# Patient Record
Sex: Male | Born: 1955 | Race: White | Hispanic: No | Marital: Single | Smoking: Current every day smoker
Health system: Southern US, Community
[De-identification: ages and names within clinical notes are randomized; demographics above are authoritative.]

## PROBLEM LIST (undated history)

## (undated) DIAGNOSIS — E785 Hyperlipidemia, unspecified: Secondary | ICD-10-CM

## (undated) DIAGNOSIS — F17200 Nicotine dependence, unspecified, uncomplicated: Secondary | ICD-10-CM

## (undated) DIAGNOSIS — J189 Pneumonia, unspecified organism: Secondary | ICD-10-CM

## (undated) DIAGNOSIS — B20 Human immunodeficiency virus [HIV] disease: Secondary | ICD-10-CM

## (undated) DIAGNOSIS — F419 Anxiety disorder, unspecified: Secondary | ICD-10-CM

## (undated) DIAGNOSIS — Z21 Asymptomatic human immunodeficiency virus [HIV] infection status: Secondary | ICD-10-CM

## (undated) DIAGNOSIS — B351 Tinea unguium: Secondary | ICD-10-CM

## (undated) DIAGNOSIS — J42 Unspecified chronic bronchitis: Secondary | ICD-10-CM

## (undated) DIAGNOSIS — J9383 Other pneumothorax: Secondary | ICD-10-CM

## (undated) HISTORY — PX: CHEST TUBE INSERTION: SHX231

## (undated) HISTORY — DX: Other pneumothorax: J93.83

## (undated) HISTORY — DX: Pneumonia, unspecified organism: J18.9

## (undated) HISTORY — DX: Tinea unguium: B35.1

## (undated) HISTORY — DX: Anxiety disorder, unspecified: F41.9

## (undated) HISTORY — DX: Nicotine dependence, unspecified, uncomplicated: F17.200

## (undated) HISTORY — DX: Asymptomatic human immunodeficiency virus (hiv) infection status: Z21

## (undated) HISTORY — DX: Human immunodeficiency virus (HIV) disease: B20

## (undated) HISTORY — DX: Unspecified chronic bronchitis: J42

---

## 1995-12-12 ENCOUNTER — Encounter (INDEPENDENT_AMBULATORY_CARE_PROVIDER_SITE_OTHER): Payer: Self-pay | Admitting: *Deleted

## 1995-12-12 LAB — CONVERTED CEMR LAB: CD4 Count: 670 microliters

## 2000-01-04 ENCOUNTER — Ambulatory Visit (HOSPITAL_COMMUNITY): Admission: RE | Admit: 2000-01-04 | Discharge: 2000-01-04 | Payer: Self-pay | Admitting: Infectious Diseases

## 2000-01-04 ENCOUNTER — Encounter: Admission: RE | Admit: 2000-01-04 | Discharge: 2000-01-04 | Payer: Self-pay | Admitting: Infectious Diseases

## 2000-01-23 ENCOUNTER — Encounter: Admission: RE | Admit: 2000-01-23 | Discharge: 2000-01-23 | Payer: Self-pay | Admitting: Infectious Diseases

## 2000-01-27 ENCOUNTER — Ambulatory Visit (HOSPITAL_COMMUNITY): Admission: RE | Admit: 2000-01-27 | Discharge: 2000-01-27 | Payer: Self-pay | Admitting: Infectious Diseases

## 2000-02-29 ENCOUNTER — Ambulatory Visit (HOSPITAL_COMMUNITY): Admission: RE | Admit: 2000-02-29 | Discharge: 2000-02-29 | Payer: Self-pay | Admitting: Family Medicine

## 2000-02-29 ENCOUNTER — Encounter: Payer: Self-pay | Admitting: Family Medicine

## 2000-04-04 ENCOUNTER — Encounter: Admission: RE | Admit: 2000-04-04 | Discharge: 2000-04-04 | Payer: Self-pay | Admitting: Internal Medicine

## 2000-04-18 ENCOUNTER — Encounter: Admission: RE | Admit: 2000-04-18 | Discharge: 2000-04-18 | Payer: Self-pay | Admitting: Infectious Diseases

## 2000-04-18 ENCOUNTER — Ambulatory Visit (HOSPITAL_COMMUNITY): Admission: RE | Admit: 2000-04-18 | Discharge: 2000-04-18 | Payer: Self-pay | Admitting: Infectious Diseases

## 2000-05-07 ENCOUNTER — Encounter: Admission: RE | Admit: 2000-05-07 | Discharge: 2000-05-07 | Payer: Self-pay | Admitting: Infectious Diseases

## 2000-07-30 ENCOUNTER — Encounter: Admission: RE | Admit: 2000-07-30 | Discharge: 2000-07-30 | Payer: Self-pay | Admitting: Infectious Diseases

## 2000-07-30 ENCOUNTER — Ambulatory Visit (HOSPITAL_COMMUNITY): Admission: RE | Admit: 2000-07-30 | Discharge: 2000-07-30 | Payer: Self-pay | Admitting: Infectious Diseases

## 2000-08-13 ENCOUNTER — Encounter: Admission: RE | Admit: 2000-08-13 | Discharge: 2000-08-13 | Payer: Self-pay | Admitting: Infectious Diseases

## 2001-02-04 ENCOUNTER — Ambulatory Visit (HOSPITAL_COMMUNITY): Admission: RE | Admit: 2001-02-04 | Discharge: 2001-02-04 | Payer: Self-pay | Admitting: Infectious Diseases

## 2001-02-04 ENCOUNTER — Encounter: Admission: RE | Admit: 2001-02-04 | Discharge: 2001-02-04 | Payer: Self-pay | Admitting: Infectious Diseases

## 2001-02-26 ENCOUNTER — Encounter: Admission: RE | Admit: 2001-02-26 | Discharge: 2001-02-26 | Payer: Self-pay | Admitting: Infectious Diseases

## 2001-08-14 ENCOUNTER — Ambulatory Visit (HOSPITAL_COMMUNITY): Admission: RE | Admit: 2001-08-14 | Discharge: 2001-08-14 | Payer: Self-pay | Admitting: Infectious Diseases

## 2001-08-14 ENCOUNTER — Encounter: Admission: RE | Admit: 2001-08-14 | Discharge: 2001-08-14 | Payer: Self-pay | Admitting: Internal Medicine

## 2001-09-16 ENCOUNTER — Encounter: Admission: RE | Admit: 2001-09-16 | Discharge: 2001-09-16 | Payer: Self-pay | Admitting: Infectious Diseases

## 2001-10-21 ENCOUNTER — Encounter: Admission: RE | Admit: 2001-10-21 | Discharge: 2001-10-21 | Payer: Self-pay | Admitting: Infectious Diseases

## 2001-11-20 ENCOUNTER — Encounter: Admission: RE | Admit: 2001-11-20 | Discharge: 2001-11-20 | Payer: Self-pay | Admitting: Infectious Diseases

## 2001-11-20 ENCOUNTER — Ambulatory Visit (HOSPITAL_COMMUNITY): Admission: RE | Admit: 2001-11-20 | Discharge: 2001-11-20 | Payer: Self-pay | Admitting: Infectious Diseases

## 2001-12-11 ENCOUNTER — Encounter: Payer: Self-pay | Admitting: Family Medicine

## 2001-12-11 ENCOUNTER — Ambulatory Visit (HOSPITAL_COMMUNITY): Admission: RE | Admit: 2001-12-11 | Discharge: 2001-12-11 | Payer: Self-pay | Admitting: Family Medicine

## 2001-12-23 ENCOUNTER — Encounter: Admission: RE | Admit: 2001-12-23 | Discharge: 2001-12-23 | Payer: Self-pay | Admitting: Infectious Diseases

## 2001-12-30 ENCOUNTER — Encounter: Payer: Self-pay | Admitting: Infectious Diseases

## 2001-12-30 ENCOUNTER — Encounter: Admission: RE | Admit: 2001-12-30 | Discharge: 2001-12-30 | Payer: Self-pay | Admitting: Infectious Diseases

## 2001-12-30 ENCOUNTER — Ambulatory Visit (HOSPITAL_COMMUNITY): Admission: RE | Admit: 2001-12-30 | Discharge: 2001-12-30 | Payer: Self-pay | Admitting: Infectious Diseases

## 2002-01-13 ENCOUNTER — Encounter: Admission: RE | Admit: 2002-01-13 | Discharge: 2002-01-13 | Payer: Self-pay | Admitting: Infectious Diseases

## 2002-03-31 ENCOUNTER — Encounter: Admission: RE | Admit: 2002-03-31 | Discharge: 2002-03-31 | Payer: Self-pay | Admitting: Infectious Diseases

## 2002-03-31 ENCOUNTER — Ambulatory Visit (HOSPITAL_COMMUNITY): Admission: RE | Admit: 2002-03-31 | Discharge: 2002-03-31 | Payer: Self-pay | Admitting: Infectious Diseases

## 2002-04-21 ENCOUNTER — Encounter: Admission: RE | Admit: 2002-04-21 | Discharge: 2002-04-21 | Payer: Self-pay | Admitting: Infectious Diseases

## 2002-07-10 ENCOUNTER — Encounter: Admission: RE | Admit: 2002-07-10 | Discharge: 2002-07-10 | Payer: Self-pay | Admitting: Infectious Diseases

## 2002-07-23 ENCOUNTER — Encounter: Payer: Self-pay | Admitting: Emergency Medicine

## 2002-07-23 ENCOUNTER — Emergency Department (HOSPITAL_COMMUNITY): Admission: EM | Admit: 2002-07-23 | Discharge: 2002-07-23 | Payer: Self-pay | Admitting: Emergency Medicine

## 2002-07-29 ENCOUNTER — Encounter: Admission: RE | Admit: 2002-07-29 | Discharge: 2002-07-29 | Payer: Self-pay | Admitting: Infectious Diseases

## 2002-10-27 ENCOUNTER — Encounter (INDEPENDENT_AMBULATORY_CARE_PROVIDER_SITE_OTHER): Payer: Self-pay | Admitting: Infectious Diseases

## 2002-10-27 ENCOUNTER — Encounter: Admission: RE | Admit: 2002-10-27 | Discharge: 2002-10-27 | Payer: Self-pay | Admitting: Infectious Diseases

## 2002-11-03 ENCOUNTER — Encounter: Payer: Self-pay | Admitting: Family Medicine

## 2002-11-03 ENCOUNTER — Ambulatory Visit (HOSPITAL_COMMUNITY): Admission: RE | Admit: 2002-11-03 | Discharge: 2002-11-03 | Payer: Self-pay | Admitting: Family Medicine

## 2002-11-17 ENCOUNTER — Encounter: Admission: RE | Admit: 2002-11-17 | Discharge: 2002-11-17 | Payer: Self-pay | Admitting: Infectious Diseases

## 2003-02-16 ENCOUNTER — Encounter (INDEPENDENT_AMBULATORY_CARE_PROVIDER_SITE_OTHER): Payer: Self-pay | Admitting: Infectious Diseases

## 2003-02-16 ENCOUNTER — Ambulatory Visit (HOSPITAL_COMMUNITY): Admission: RE | Admit: 2003-02-16 | Discharge: 2003-02-16 | Payer: Self-pay | Admitting: Infectious Diseases

## 2003-02-16 ENCOUNTER — Encounter: Admission: RE | Admit: 2003-02-16 | Discharge: 2003-02-16 | Payer: Self-pay | Admitting: Infectious Diseases

## 2003-03-02 ENCOUNTER — Encounter: Admission: RE | Admit: 2003-03-02 | Discharge: 2003-03-02 | Payer: Self-pay | Admitting: Infectious Diseases

## 2003-05-02 ENCOUNTER — Ambulatory Visit (HOSPITAL_COMMUNITY): Admission: RE | Admit: 2003-05-02 | Discharge: 2003-05-02 | Payer: Self-pay | Admitting: Family Medicine

## 2003-06-03 ENCOUNTER — Ambulatory Visit (HOSPITAL_COMMUNITY): Admission: RE | Admit: 2003-06-03 | Discharge: 2003-06-03 | Payer: Self-pay | Admitting: Family Medicine

## 2003-06-11 ENCOUNTER — Ambulatory Visit (HOSPITAL_COMMUNITY): Admission: RE | Admit: 2003-06-11 | Discharge: 2003-06-11 | Payer: Self-pay | Admitting: Family Medicine

## 2003-06-15 ENCOUNTER — Encounter: Admission: RE | Admit: 2003-06-15 | Discharge: 2003-06-15 | Payer: Self-pay | Admitting: Infectious Diseases

## 2003-06-29 ENCOUNTER — Encounter: Admission: RE | Admit: 2003-06-29 | Discharge: 2003-06-29 | Payer: Self-pay | Admitting: Infectious Diseases

## 2003-10-27 ENCOUNTER — Encounter: Admission: RE | Admit: 2003-10-27 | Discharge: 2003-10-27 | Payer: Self-pay | Admitting: Infectious Diseases

## 2003-10-27 ENCOUNTER — Ambulatory Visit (HOSPITAL_COMMUNITY): Admission: RE | Admit: 2003-10-27 | Discharge: 2003-10-27 | Payer: Self-pay | Admitting: Infectious Diseases

## 2003-11-09 ENCOUNTER — Encounter: Admission: RE | Admit: 2003-11-09 | Discharge: 2003-11-09 | Payer: Self-pay | Admitting: Infectious Diseases

## 2004-03-15 ENCOUNTER — Ambulatory Visit: Payer: Self-pay | Admitting: Infectious Diseases

## 2004-04-07 ENCOUNTER — Ambulatory Visit: Payer: Self-pay | Admitting: Infectious Diseases

## 2004-04-07 ENCOUNTER — Ambulatory Visit (HOSPITAL_COMMUNITY): Admission: RE | Admit: 2004-04-07 | Discharge: 2004-04-07 | Payer: Self-pay | Admitting: Infectious Diseases

## 2004-04-25 ENCOUNTER — Ambulatory Visit: Payer: Self-pay | Admitting: Infectious Diseases

## 2004-09-26 ENCOUNTER — Ambulatory Visit (HOSPITAL_COMMUNITY): Admission: RE | Admit: 2004-09-26 | Discharge: 2004-09-26 | Payer: Self-pay | Admitting: Infectious Diseases

## 2004-09-26 ENCOUNTER — Ambulatory Visit: Payer: Self-pay | Admitting: Infectious Diseases

## 2004-10-10 ENCOUNTER — Ambulatory Visit: Payer: Self-pay | Admitting: Infectious Diseases

## 2005-04-03 ENCOUNTER — Ambulatory Visit: Payer: Self-pay | Admitting: Infectious Diseases

## 2005-04-03 ENCOUNTER — Ambulatory Visit (HOSPITAL_COMMUNITY): Admission: RE | Admit: 2005-04-03 | Discharge: 2005-04-03 | Payer: Self-pay | Admitting: Infectious Diseases

## 2005-04-24 ENCOUNTER — Ambulatory Visit: Payer: Self-pay | Admitting: Infectious Diseases

## 2005-08-14 ENCOUNTER — Encounter (INDEPENDENT_AMBULATORY_CARE_PROVIDER_SITE_OTHER): Payer: Self-pay | Admitting: *Deleted

## 2005-08-14 ENCOUNTER — Ambulatory Visit: Payer: Self-pay | Admitting: Infectious Diseases

## 2005-08-14 ENCOUNTER — Encounter: Admission: RE | Admit: 2005-08-14 | Discharge: 2005-08-14 | Payer: Self-pay | Admitting: Infectious Diseases

## 2005-08-28 ENCOUNTER — Ambulatory Visit: Payer: Self-pay | Admitting: Infectious Diseases

## 2005-12-25 ENCOUNTER — Encounter (INDEPENDENT_AMBULATORY_CARE_PROVIDER_SITE_OTHER): Payer: Self-pay | Admitting: *Deleted

## 2005-12-25 ENCOUNTER — Encounter: Admission: RE | Admit: 2005-12-25 | Discharge: 2005-12-25 | Payer: Self-pay | Admitting: Infectious Diseases

## 2005-12-25 ENCOUNTER — Ambulatory Visit: Payer: Self-pay | Admitting: Infectious Diseases

## 2005-12-25 LAB — CONVERTED CEMR LAB: HIV 1 RNA Quant: 1480 copies/mL

## 2006-01-08 ENCOUNTER — Ambulatory Visit: Payer: Self-pay | Admitting: Infectious Diseases

## 2006-03-30 HISTORY — PX: OTHER SURGICAL HISTORY: SHX169

## 2006-06-04 ENCOUNTER — Ambulatory Visit: Payer: Self-pay | Admitting: Infectious Diseases

## 2006-06-04 ENCOUNTER — Encounter (INDEPENDENT_AMBULATORY_CARE_PROVIDER_SITE_OTHER): Payer: Self-pay | Admitting: *Deleted

## 2006-06-04 ENCOUNTER — Encounter: Admission: RE | Admit: 2006-06-04 | Discharge: 2006-06-04 | Payer: Self-pay | Admitting: Infectious Diseases

## 2006-06-04 LAB — CONVERTED CEMR LAB
ALT: 15 units/L (ref 0–53)
AST: 17 units/L (ref 0–37)
Albumin: 4.2 g/dL (ref 3.5–5.2)
Alkaline Phosphatase: 86 units/L (ref 39–117)
BUN: 14 mg/dL (ref 6–23)
Basophils Absolute: 0 10*3/uL (ref 0.0–0.1)
CD4 Count: 690 microliters
CO2: 26 meq/L (ref 19–32)
Calcium: 9.2 mg/dL (ref 8.4–10.5)
Chloride: 106 meq/L (ref 96–112)
HCT: 48.5 % (ref 39.0–52.0)
HIV 1 RNA Quant: 2280 copies/mL — ABNORMAL HIGH (ref <50)
HIV-1 RNA Quant, Log: 3.36 — ABNORMAL HIGH (ref <1.70)
Lymphs Abs: 2.7 10*3/uL (ref 0.7–3.3)
Monocytes Absolute: 0.5 10*3/uL (ref 0.2–0.7)
Monocytes Relative: 8 % (ref 3–11)
Platelets: 162 10*3/uL (ref 150–400)
Sodium: 139 meq/L (ref 135–145)
Total Bilirubin: 0.5 mg/dL (ref 0.3–1.2)
Total Protein: 7.6 g/dL (ref 6.0–8.3)

## 2006-06-18 ENCOUNTER — Ambulatory Visit: Payer: Self-pay | Admitting: Infectious Diseases

## 2006-07-19 DIAGNOSIS — F17200 Nicotine dependence, unspecified, uncomplicated: Secondary | ICD-10-CM | POA: Insufficient documentation

## 2006-07-19 DIAGNOSIS — B20 Human immunodeficiency virus [HIV] disease: Secondary | ICD-10-CM | POA: Insufficient documentation

## 2006-07-19 DIAGNOSIS — B351 Tinea unguium: Secondary | ICD-10-CM | POA: Insufficient documentation

## 2006-07-19 DIAGNOSIS — F411 Generalized anxiety disorder: Secondary | ICD-10-CM | POA: Insufficient documentation

## 2006-07-19 DIAGNOSIS — J939 Pneumothorax, unspecified: Secondary | ICD-10-CM | POA: Insufficient documentation

## 2006-07-19 DIAGNOSIS — J930 Spontaneous tension pneumothorax: Secondary | ICD-10-CM

## 2006-07-23 ENCOUNTER — Encounter (INDEPENDENT_AMBULATORY_CARE_PROVIDER_SITE_OTHER): Payer: Self-pay | Admitting: *Deleted

## 2006-07-23 LAB — CONVERTED CEMR LAB

## 2006-07-24 ENCOUNTER — Encounter (INDEPENDENT_AMBULATORY_CARE_PROVIDER_SITE_OTHER): Payer: Self-pay | Admitting: Infectious Diseases

## 2006-08-05 ENCOUNTER — Encounter (INDEPENDENT_AMBULATORY_CARE_PROVIDER_SITE_OTHER): Payer: Self-pay | Admitting: *Deleted

## 2006-10-16 ENCOUNTER — Encounter: Admission: RE | Admit: 2006-10-16 | Discharge: 2006-10-16 | Payer: Self-pay | Admitting: *Deleted

## 2007-01-25 ENCOUNTER — Encounter: Admission: RE | Admit: 2007-01-25 | Discharge: 2007-01-25 | Payer: Self-pay | Admitting: Infectious Disease

## 2007-01-25 ENCOUNTER — Ambulatory Visit: Payer: Self-pay | Admitting: Infectious Disease

## 2007-01-25 LAB — CONVERTED CEMR LAB
AST: 16 units/L (ref 0–37)
Alkaline Phosphatase: 83 units/L (ref 39–117)
BUN: 12 mg/dL (ref 6–23)
Basophils Relative: 0 % (ref 0–1)
Calcium: 8.9 mg/dL (ref 8.4–10.5)
Creatinine, Ser: 1 mg/dL (ref 0.40–1.50)
Eosinophils Absolute: 0.3 10*3/uL (ref 0.0–0.7)
Eosinophils Relative: 4 % (ref 0–5)
Hemoglobin: 16.6 g/dL (ref 13.0–17.0)
MCHC: 34.9 g/dL (ref 30.0–36.0)
MCV: 93 fL (ref 78.0–100.0)
Monocytes Absolute: 0.4 10*3/uL (ref 0.2–0.7)
Monocytes Relative: 6 % (ref 3–11)
RBC: 5.12 M/uL (ref 4.22–5.81)
Total Bilirubin: 0.5 mg/dL (ref 0.3–1.2)

## 2007-02-11 ENCOUNTER — Ambulatory Visit: Payer: Self-pay | Admitting: Infectious Disease

## 2007-02-11 DIAGNOSIS — R1310 Dysphagia, unspecified: Secondary | ICD-10-CM | POA: Insufficient documentation

## 2007-05-08 ENCOUNTER — Telehealth: Payer: Self-pay | Admitting: Internal Medicine

## 2007-05-10 ENCOUNTER — Ambulatory Visit (HOSPITAL_COMMUNITY): Admission: RE | Admit: 2007-05-10 | Discharge: 2007-05-10 | Payer: Self-pay | Admitting: Internal Medicine

## 2007-05-10 ENCOUNTER — Ambulatory Visit: Payer: Self-pay | Admitting: Internal Medicine

## 2007-05-10 DIAGNOSIS — R05 Cough: Secondary | ICD-10-CM

## 2007-05-10 DIAGNOSIS — R059 Cough, unspecified: Secondary | ICD-10-CM | POA: Insufficient documentation

## 2007-05-15 ENCOUNTER — Ambulatory Visit: Payer: Self-pay | Admitting: Internal Medicine

## 2007-05-16 ENCOUNTER — Encounter: Payer: Self-pay | Admitting: Internal Medicine

## 2007-05-17 ENCOUNTER — Telehealth: Payer: Self-pay

## 2007-05-21 ENCOUNTER — Telehealth: Payer: Self-pay | Admitting: Internal Medicine

## 2007-05-28 ENCOUNTER — Encounter: Payer: Self-pay | Admitting: Internal Medicine

## 2007-06-10 ENCOUNTER — Ambulatory Visit: Payer: Self-pay | Admitting: Infectious Disease

## 2007-06-10 ENCOUNTER — Encounter: Admission: RE | Admit: 2007-06-10 | Discharge: 2007-06-10 | Payer: Self-pay | Admitting: Infectious Disease

## 2007-06-10 LAB — CONVERTED CEMR LAB
Albumin: 4.1 g/dL (ref 3.5–5.2)
Alkaline Phosphatase: 77 units/L (ref 39–117)
BUN: 13 mg/dL (ref 6–23)
Calcium: 9.3 mg/dL (ref 8.4–10.5)
Cholesterol: 124 mg/dL (ref 0–200)
Eosinophils Absolute: 0.3 10*3/uL (ref 0.0–0.7)
Glucose, Bld: 107 mg/dL — ABNORMAL HIGH (ref 70–99)
HCT: 48.5 % (ref 39.0–52.0)
HDL: 27 mg/dL — ABNORMAL LOW (ref >39)
LDL Cholesterol: 61 mg/dL (ref 0–99)
Lymphs Abs: 2.4 10*3/uL (ref 0.7–4.0)
MCV: 93.4 fL (ref 78.0–100.0)
Monocytes Relative: 6 % (ref 3–12)
Neutrophils Relative %: 46 % (ref 43–77)
Potassium: 4.4 meq/L (ref 3.5–5.3)
RBC: 5.19 M/uL (ref 4.22–5.81)
Triglycerides: 182 mg/dL — ABNORMAL HIGH (ref <150)
WBC: 5.6 10*3/uL (ref 4.0–10.5)

## 2007-06-28 ENCOUNTER — Ambulatory Visit: Payer: Self-pay | Admitting: Internal Medicine

## 2007-09-30 ENCOUNTER — Ambulatory Visit: Payer: Self-pay | Admitting: Internal Medicine

## 2007-09-30 ENCOUNTER — Encounter: Admission: RE | Admit: 2007-09-30 | Discharge: 2007-09-30 | Payer: Self-pay | Admitting: Internal Medicine

## 2007-09-30 LAB — CONVERTED CEMR LAB
Albumin: 3.9 g/dL (ref 3.5–5.2)
Alkaline Phosphatase: 72 units/L (ref 39–117)
BUN: 13 mg/dL (ref 6–23)
CO2: 25 meq/L (ref 19–32)
Eosinophils Absolute: 0.2 10*3/uL (ref 0.0–0.7)
Eosinophils Relative: 5 % (ref 0–5)
Glucose, Bld: 84 mg/dL (ref 70–99)
HCT: 45.6 % (ref 39.0–52.0)
HIV 1 RNA Quant: 6520 copies/mL — ABNORMAL HIGH (ref <50)
HIV-1 RNA Quant, Log: 3.81 — ABNORMAL HIGH (ref <1.70)
Hemoglobin: 15.4 g/dL (ref 13.0–17.0)
Lymphs Abs: 2.3 10*3/uL (ref 0.7–4.0)
MCV: 95 fL (ref 78.0–100.0)
Monocytes Relative: 8 % (ref 3–12)
RBC: 4.8 M/uL (ref 4.22–5.81)
Total Bilirubin: 0.5 mg/dL (ref 0.3–1.2)
WBC: 4.8 10*3/uL (ref 4.0–10.5)

## 2007-10-16 ENCOUNTER — Ambulatory Visit: Payer: Self-pay | Admitting: Internal Medicine

## 2007-11-28 ENCOUNTER — Telehealth: Payer: Self-pay | Admitting: Internal Medicine

## 2008-01-28 ENCOUNTER — Ambulatory Visit: Payer: Self-pay | Admitting: Internal Medicine

## 2008-01-28 LAB — CONVERTED CEMR LAB
ALT: 12 units/L (ref 0–53)
Albumin: 4 g/dL (ref 3.5–5.2)
CO2: 23 meq/L (ref 19–32)
Calcium: 8.9 mg/dL (ref 8.4–10.5)
Chloride: 106 meq/L (ref 96–112)
Cholesterol: 117 mg/dL (ref 0–200)
Eosinophils Relative: 7 % — ABNORMAL HIGH (ref 0–5)
Glucose, Bld: 95 mg/dL (ref 70–99)
HCT: 46.5 % (ref 39.0–52.0)
HIV 1 RNA Quant: 8820 copies/mL — ABNORMAL HIGH (ref <50)
HIV-1 RNA Quant, Log: 3.95 — ABNORMAL HIGH (ref <1.70)
Hemoglobin: 15.5 g/dL (ref 13.0–17.0)
Lymphocytes Relative: 41 % (ref 12–46)
Lymphs Abs: 2.4 10*3/uL (ref 0.7–4.0)
Neutro Abs: 2.6 10*3/uL (ref 1.7–7.7)
Platelets: 156 10*3/uL (ref 150–400)
Sodium: 141 meq/L (ref 135–145)
Total Bilirubin: 0.4 mg/dL (ref 0.3–1.2)
Total Protein: 7.5 g/dL (ref 6.0–8.3)
Triglycerides: 212 mg/dL — ABNORMAL HIGH (ref <150)
VLDL: 42 mg/dL — ABNORMAL HIGH (ref 0–40)
WBC: 5.9 10*3/uL (ref 4.0–10.5)

## 2008-02-14 ENCOUNTER — Ambulatory Visit: Payer: Self-pay | Admitting: Internal Medicine

## 2008-02-14 DIAGNOSIS — J180 Bronchopneumonia, unspecified organism: Secondary | ICD-10-CM | POA: Insufficient documentation

## 2008-02-21 ENCOUNTER — Encounter: Payer: Self-pay | Admitting: Internal Medicine

## 2008-03-06 ENCOUNTER — Ambulatory Visit: Payer: Self-pay | Admitting: Internal Medicine

## 2008-03-09 ENCOUNTER — Encounter: Payer: Self-pay | Admitting: Internal Medicine

## 2008-03-14 IMAGING — CR DG CHEST 2V
2 series · 2 of 2 positions shown · non-contrast
Comparison: 10/16/2006

CLINICAL DATA: Short of breath.  Cough for three weeks.   Smoking history. 
 CHEST ? 2 VIEW:

[view not recorded (1 of 2)]
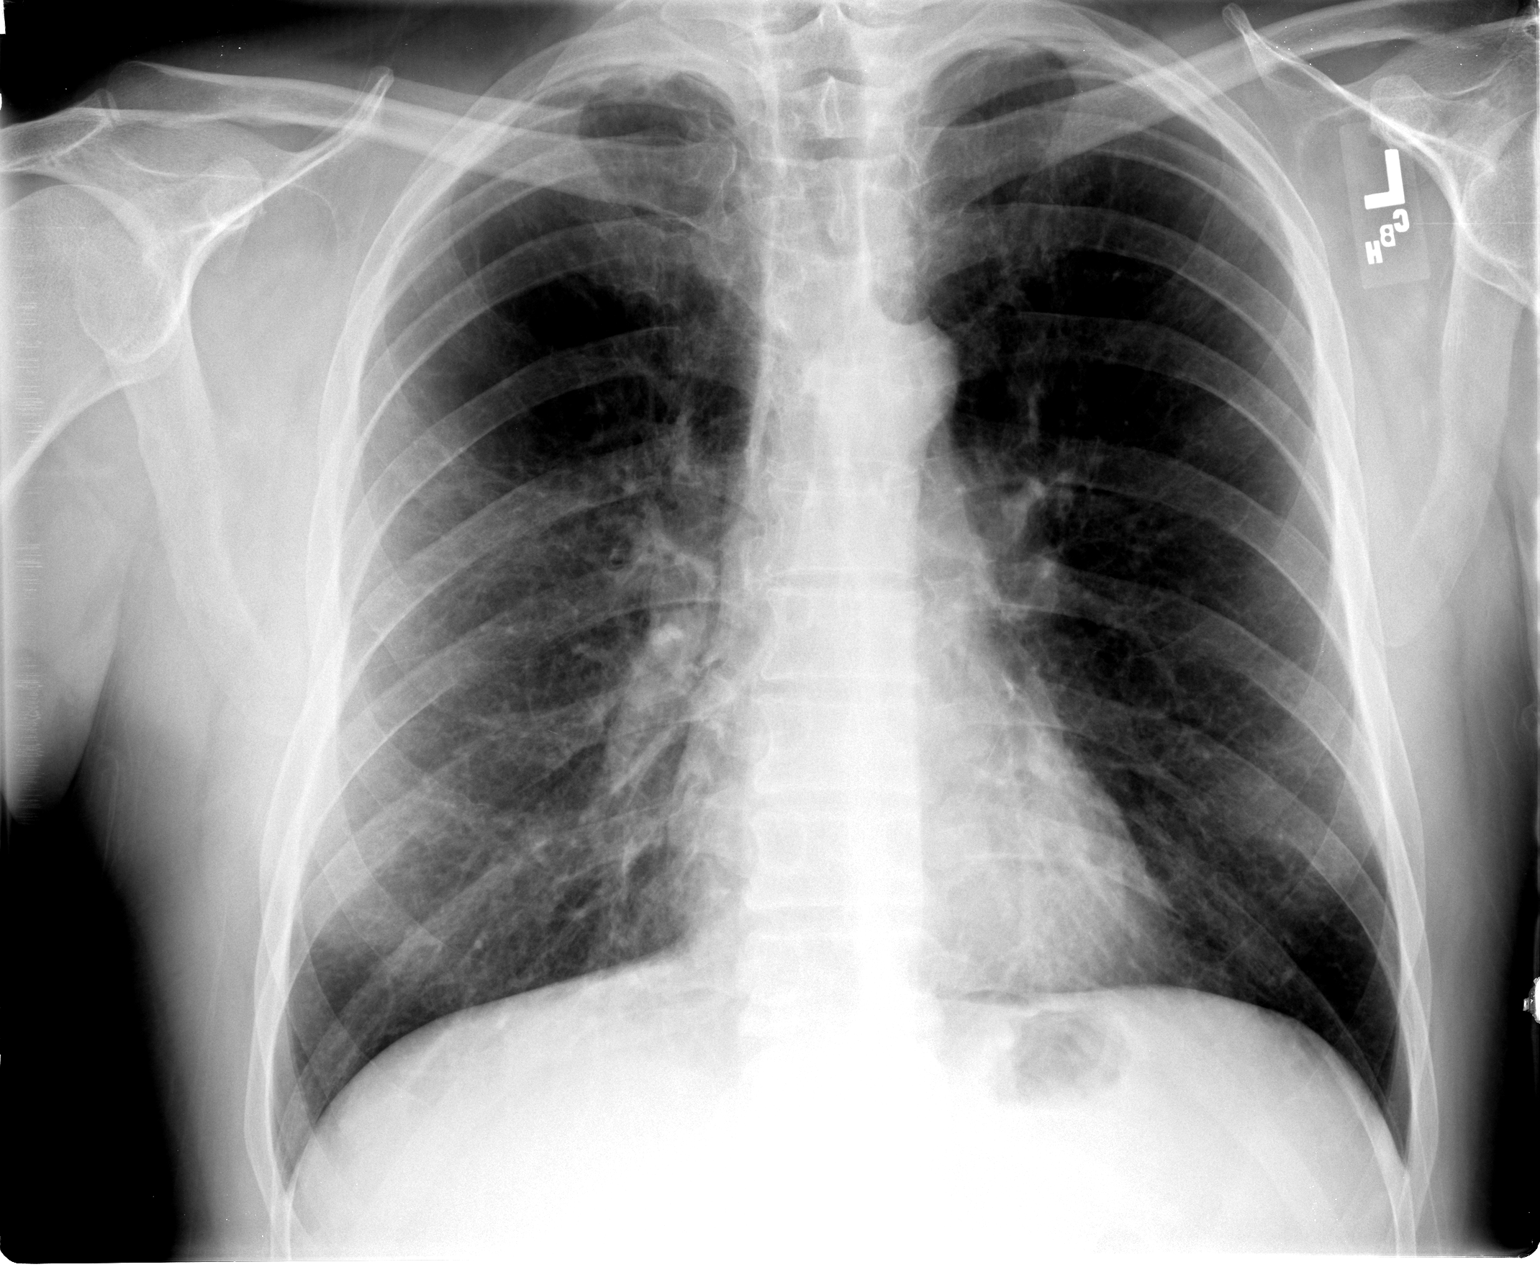

[view not recorded (2 of 2)]
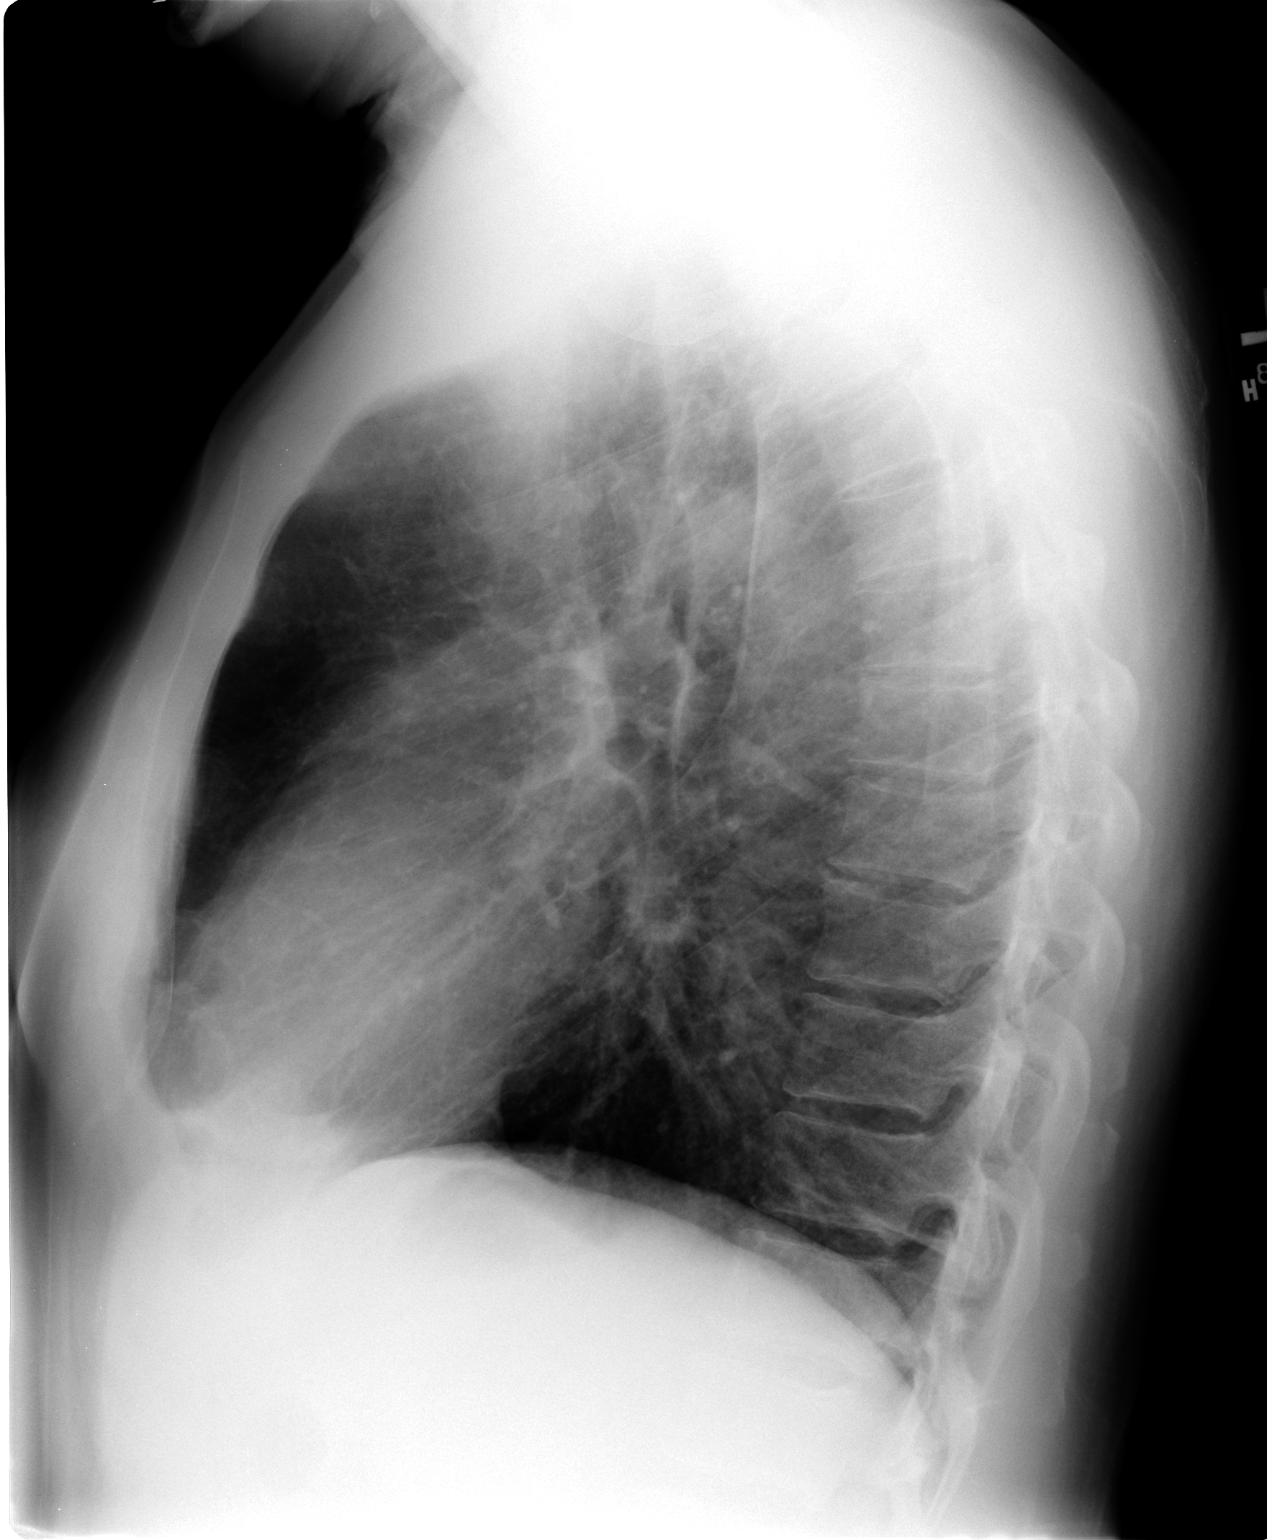

[2 of 2 positions shown; findings below may reference images not displayed]

FINDINGS: The heart and mediastinum are unremarkable.  There is bronchial thickening with no infiltrate, collapse or effusion.  There is chronic pleural and parenchymal scarring at the apices.
IMPRESSION: Bronchitis without consolidation or collapse.

## 2008-06-22 ENCOUNTER — Telehealth: Payer: Self-pay | Admitting: Internal Medicine

## 2008-06-23 ENCOUNTER — Ambulatory Visit: Payer: Self-pay | Admitting: Internal Medicine

## 2008-06-29 ENCOUNTER — Telehealth: Payer: Self-pay | Admitting: Internal Medicine

## 2008-06-30 ENCOUNTER — Encounter: Payer: Self-pay | Admitting: Internal Medicine

## 2008-07-01 ENCOUNTER — Telehealth (INDEPENDENT_AMBULATORY_CARE_PROVIDER_SITE_OTHER): Payer: Self-pay | Admitting: Licensed Clinical Social Worker

## 2008-07-17 ENCOUNTER — Ambulatory Visit: Payer: Self-pay | Admitting: Internal Medicine

## 2008-07-17 DIAGNOSIS — K047 Periapical abscess without sinus: Secondary | ICD-10-CM | POA: Insufficient documentation

## 2008-07-17 DIAGNOSIS — J42 Unspecified chronic bronchitis: Secondary | ICD-10-CM

## 2008-07-17 DIAGNOSIS — R141 Gas pain: Secondary | ICD-10-CM | POA: Insufficient documentation

## 2008-07-17 DIAGNOSIS — R142 Eructation: Secondary | ICD-10-CM

## 2008-07-17 DIAGNOSIS — R143 Flatulence: Secondary | ICD-10-CM

## 2008-07-17 DIAGNOSIS — N529 Male erectile dysfunction, unspecified: Secondary | ICD-10-CM

## 2008-08-14 LAB — CONVERTED CEMR LAB: Testosterone: 313.99 ng/dL — ABNORMAL LOW (ref 350.00–890)

## 2008-09-10 ENCOUNTER — Ambulatory Visit: Payer: Self-pay | Admitting: Internal Medicine

## 2008-09-10 LAB — CONVERTED CEMR LAB
ALT: 14 units/L (ref 0–53)
AST: 18 units/L (ref 0–37)
Albumin: 3.9 g/dL (ref 3.5–5.2)
Alkaline Phosphatase: 79 units/L (ref 39–117)
Basophils Relative: 0 % (ref 0–1)
Calcium: 9.4 mg/dL (ref 8.4–10.5)
Chloride: 106 meq/L (ref 96–112)
Eosinophils Absolute: 0.2 10*3/uL (ref 0.0–0.7)
Eosinophils Relative: 3 % (ref 0–5)
HCT: 46.1 % (ref 39.0–52.0)
HDL: 25 mg/dL — ABNORMAL LOW (ref >39)
LDL Cholesterol: 42 mg/dL (ref 0–99)
Lymphs Abs: 2.5 10*3/uL (ref 0.7–4.0)
MCHC: 34.1 g/dL (ref 30.0–36.0)
MCV: 91.8 fL (ref 78.0–100.0)
Monocytes Relative: 9 % (ref 3–12)
Neutrophils Relative %: 48 % (ref 43–77)
Platelets: 149 10*3/uL — ABNORMAL LOW (ref 150–400)
Potassium: 4.1 meq/L (ref 3.5–5.3)
Sodium: 142 meq/L (ref 135–145)
Testosterone: 340.57 ng/dL — ABNORMAL LOW (ref 350–890)
Total CHOL/HDL Ratio: 5.2
Total Protein: 7.7 g/dL (ref 6.0–8.3)
WBC: 6.3 10*3/uL (ref 4.0–10.5)

## 2008-09-22 ENCOUNTER — Ambulatory Visit: Payer: Self-pay | Admitting: Internal Medicine

## 2008-09-23 ENCOUNTER — Telehealth (INDEPENDENT_AMBULATORY_CARE_PROVIDER_SITE_OTHER): Payer: Self-pay | Admitting: *Deleted

## 2008-09-23 ENCOUNTER — Encounter: Payer: Self-pay | Admitting: Internal Medicine

## 2008-10-02 ENCOUNTER — Encounter: Admission: RE | Admit: 2008-10-02 | Discharge: 2008-10-02 | Payer: Self-pay | Admitting: Internal Medicine

## 2008-10-02 ENCOUNTER — Encounter: Payer: Self-pay | Admitting: Internal Medicine

## 2008-10-05 ENCOUNTER — Ambulatory Visit: Payer: Self-pay | Admitting: Internal Medicine

## 2008-10-05 LAB — CONVERTED CEMR LAB
ALT: 18 units/L (ref 0–53)
AST: 21 units/L (ref 0–37)
Albumin: 3.5 g/dL (ref 3.5–5.2)
Alkaline Phosphatase: 72 units/L (ref 39–117)
Basophils Absolute: 0 10*3/uL (ref 0.0–0.1)
Basophils Relative: 0.3 % (ref 0.0–3.0)
Calcium: 8.9 mg/dL (ref 8.4–10.5)
Eosinophils Relative: 3.4 % (ref 0.0–5.0)
GFR calc non Af Amer: 93.99 mL/min (ref >60)
HDL: 17.5 mg/dL — ABNORMAL LOW (ref >39.00)
Hemoglobin: 15.6 g/dL (ref 13.0–17.0)
LDL Cholesterol: 69 mg/dL (ref 0–99)
Lymphocytes Relative: 43.9 % (ref 12.0–46.0)
Monocytes Relative: 7.4 % (ref 3.0–12.0)
Neutro Abs: 2.3 10*3/uL (ref 1.4–7.7)
Nitrite: NEGATIVE
Potassium: 4.2 meq/L (ref 3.5–5.1)
RBC: 4.87 M/uL (ref 4.22–5.81)
RDW: 13.7 % (ref 11.5–14.6)
Sodium: 145 meq/L (ref 135–145)
Total CHOL/HDL Ratio: 7
VLDL: 31 mg/dL (ref 0.0–40.0)
WBC Urine, dipstick: NEGATIVE
WBC: 5.2 10*3/uL (ref 4.5–10.5)

## 2008-10-19 ENCOUNTER — Ambulatory Visit: Payer: Self-pay | Admitting: Internal Medicine

## 2008-10-19 DIAGNOSIS — E291 Testicular hypofunction: Secondary | ICD-10-CM

## 2008-10-23 ENCOUNTER — Ambulatory Visit: Payer: Self-pay | Admitting: Internal Medicine

## 2008-11-06 ENCOUNTER — Encounter: Payer: Self-pay | Admitting: Internal Medicine

## 2008-11-09 ENCOUNTER — Encounter: Payer: Self-pay | Admitting: Internal Medicine

## 2008-11-20 ENCOUNTER — Ambulatory Visit: Payer: Self-pay | Admitting: Internal Medicine

## 2008-12-22 ENCOUNTER — Ambulatory Visit: Payer: Self-pay | Admitting: Internal Medicine

## 2008-12-24 ENCOUNTER — Ambulatory Visit: Payer: Self-pay | Admitting: Internal Medicine

## 2008-12-24 LAB — CONVERTED CEMR LAB
Albumin: 4 g/dL (ref 3.5–5.2)
Alkaline Phosphatase: 70 units/L (ref 39–117)
BUN: 11 mg/dL (ref 6–23)
Calcium: 9.2 mg/dL (ref 8.4–10.5)
Glucose, Bld: 78 mg/dL (ref 70–99)
HIV 1 RNA Quant: 19500 copies/mL — ABNORMAL HIGH (ref <48)
HIV-1 RNA Quant, Log: 4.29 — ABNORMAL HIGH (ref <1.68)
Hemoglobin: 15.3 g/dL (ref 13.0–17.0)
Lymphocytes Relative: 58 % — ABNORMAL HIGH (ref 12–46)
Monocytes Absolute: 0.5 10*3/uL (ref 0.1–1.0)
Monocytes Relative: 9 % (ref 3–12)
Neutro Abs: 1.7 10*3/uL (ref 1.7–7.7)
Potassium: 4 meq/L (ref 3.5–5.3)
RBC: 4.95 M/uL (ref 4.22–5.81)

## 2009-01-05 ENCOUNTER — Ambulatory Visit: Payer: Self-pay | Admitting: Internal Medicine

## 2009-01-05 DIAGNOSIS — L989 Disorder of the skin and subcutaneous tissue, unspecified: Secondary | ICD-10-CM | POA: Insufficient documentation

## 2009-01-11 ENCOUNTER — Ambulatory Visit: Payer: Self-pay | Admitting: Internal Medicine

## 2009-01-11 ENCOUNTER — Telehealth: Payer: Self-pay | Admitting: Internal Medicine

## 2009-01-11 DIAGNOSIS — C44309 Unspecified malignant neoplasm of skin of other parts of face: Secondary | ICD-10-CM | POA: Insufficient documentation

## 2009-01-11 DIAGNOSIS — J0100 Acute maxillary sinusitis, unspecified: Secondary | ICD-10-CM

## 2009-01-11 DIAGNOSIS — C44300 Unspecified malignant neoplasm of skin of unspecified part of face: Secondary | ICD-10-CM

## 2009-01-19 ENCOUNTER — Ambulatory Visit: Payer: Self-pay | Admitting: Internal Medicine

## 2009-01-19 LAB — CONVERTED CEMR LAB: Testosterone: 281.34 ng/dL — ABNORMAL LOW (ref 350.00–890.00)

## 2009-01-27 ENCOUNTER — Encounter: Payer: Self-pay | Admitting: Internal Medicine

## 2009-01-28 ENCOUNTER — Telehealth: Payer: Self-pay | Admitting: Internal Medicine

## 2009-03-22 ENCOUNTER — Ambulatory Visit: Payer: Self-pay | Admitting: Internal Medicine

## 2009-03-22 DIAGNOSIS — F410 Panic disorder [episodic paroxysmal anxiety] without agoraphobia: Secondary | ICD-10-CM

## 2009-03-31 ENCOUNTER — Ambulatory Visit: Payer: Self-pay | Admitting: Psychology

## 2009-04-01 ENCOUNTER — Ambulatory Visit: Payer: Self-pay | Admitting: Psychology

## 2009-04-07 ENCOUNTER — Ambulatory Visit: Payer: Self-pay | Admitting: Psychology

## 2009-04-09 ENCOUNTER — Ambulatory Visit: Payer: Self-pay | Admitting: Psychology

## 2009-04-16 ENCOUNTER — Ambulatory Visit: Payer: Self-pay | Admitting: Psychology

## 2009-04-21 ENCOUNTER — Ambulatory Visit: Payer: Self-pay | Admitting: Psychology

## 2009-04-26 ENCOUNTER — Ambulatory Visit: Payer: Self-pay | Admitting: Internal Medicine

## 2009-04-26 LAB — CONVERTED CEMR LAB
ALT: 14 units/L (ref 0–53)
Basophils Relative: 1 % (ref 0–1)
CO2: 23 meq/L (ref 19–32)
Chloride: 107 meq/L (ref 96–112)
HIV 1 RNA Quant: 31400 copies/mL — ABNORMAL HIGH (ref <48)
Lymphs Abs: 3.3 10*3/uL (ref 0.7–4.0)
Monocytes Relative: 8 % (ref 3–12)
Neutro Abs: 2.5 10*3/uL (ref 1.7–7.7)
Neutrophils Relative %: 38 % — ABNORMAL LOW (ref 43–77)
RBC: 4.98 M/uL (ref 4.22–5.81)
Sodium: 140 meq/L (ref 135–145)
Total Bilirubin: 0.6 mg/dL (ref 0.3–1.2)
Total Protein: 7.2 g/dL (ref 6.0–8.3)
WBC: 6.5 10*3/uL (ref 4.0–10.5)

## 2009-04-30 ENCOUNTER — Ambulatory Visit: Payer: Self-pay | Admitting: Psychology

## 2009-05-06 ENCOUNTER — Ambulatory Visit: Payer: Self-pay | Admitting: Psychology

## 2009-05-07 ENCOUNTER — Ambulatory Visit: Payer: Self-pay | Admitting: Internal Medicine

## 2009-05-11 ENCOUNTER — Ambulatory Visit: Payer: Self-pay | Admitting: Psychology

## 2009-05-18 ENCOUNTER — Ambulatory Visit: Payer: Self-pay | Admitting: Psychology

## 2009-05-26 ENCOUNTER — Ambulatory Visit: Payer: Self-pay | Admitting: Psychology

## 2009-06-14 ENCOUNTER — Ambulatory Visit: Payer: Self-pay | Admitting: Psychology

## 2009-06-17 ENCOUNTER — Telehealth: Payer: Self-pay | Admitting: Internal Medicine

## 2009-06-18 ENCOUNTER — Ambulatory Visit: Payer: Self-pay | Admitting: Internal Medicine

## 2009-06-29 ENCOUNTER — Ambulatory Visit: Payer: Self-pay | Admitting: Psychology

## 2009-07-06 ENCOUNTER — Ambulatory Visit: Payer: Self-pay | Admitting: Family Medicine

## 2009-07-06 DIAGNOSIS — L500 Allergic urticaria: Secondary | ICD-10-CM

## 2009-07-30 ENCOUNTER — Ambulatory Visit: Payer: Self-pay | Admitting: Internal Medicine

## 2009-08-05 ENCOUNTER — Encounter: Payer: Self-pay | Admitting: Internal Medicine

## 2009-08-31 ENCOUNTER — Ambulatory Visit: Payer: Self-pay | Admitting: Internal Medicine

## 2009-08-31 ENCOUNTER — Encounter: Payer: Self-pay | Admitting: Internal Medicine

## 2009-08-31 LAB — CONVERTED CEMR LAB
AST: 17 units/L (ref 0–37)
BUN: 12 mg/dL (ref 6–23)
Calcium: 9 mg/dL (ref 8.4–10.5)
Chloride: 104 meq/L (ref 96–112)
Creatinine, Ser: 1.03 mg/dL (ref 0.40–1.50)
Eosinophils Relative: 4 % (ref 0–5)
Glucose, Bld: 80 mg/dL (ref 70–99)
HCT: 44.3 % (ref 39.0–52.0)
HIV 1 RNA Quant: 35600 copies/mL — ABNORMAL HIGH (ref <48)
HIV-1 RNA Quant, Log: 4.55 — ABNORMAL HIGH (ref <1.68)
Hemoglobin: 15.1 g/dL (ref 13.0–17.0)
Lymphocytes Relative: 49 % — ABNORMAL HIGH (ref 12–46)
Lymphs Abs: 2.6 10*3/uL (ref 0.7–4.0)
Monocytes Absolute: 0.4 10*3/uL (ref 0.1–1.0)
Monocytes Relative: 7 % (ref 3–12)
RBC: 4.82 M/uL (ref 4.22–5.81)
WBC: 5.4 10*3/uL (ref 4.0–10.5)

## 2009-09-10 ENCOUNTER — Ambulatory Visit: Payer: Self-pay | Admitting: Internal Medicine

## 2009-12-01 ENCOUNTER — Ambulatory Visit: Payer: Self-pay | Admitting: Internal Medicine

## 2009-12-01 LAB — CONVERTED CEMR LAB: HIV-1 RNA Quant, Log: 4.57 — ABNORMAL HIGH (ref <1.68)

## 2009-12-02 ENCOUNTER — Encounter: Payer: Self-pay | Admitting: Internal Medicine

## 2009-12-02 LAB — CONVERTED CEMR LAB
AST: 15 units/L (ref 0–37)
Albumin: 3.9 g/dL (ref 3.5–5.2)
Alkaline Phosphatase: 78 units/L (ref 39–117)
BUN: 12 mg/dL (ref 6–23)
Calcium: 8.8 mg/dL (ref 8.4–10.5)
Chloride: 106 meq/L (ref 96–112)
Eosinophils Relative: 6 % — ABNORMAL HIGH (ref 0–5)
Glucose, Bld: 91 mg/dL (ref 70–99)
HCT: 46.8 % (ref 39.0–52.0)
Hemoglobin: 15.4 g/dL (ref 13.0–17.0)
Lymphocytes Relative: 46 % (ref 12–46)
Lymphs Abs: 2.6 10*3/uL (ref 0.7–4.0)
Monocytes Absolute: 0.4 10*3/uL (ref 0.1–1.0)
Neutro Abs: 2.2 10*3/uL (ref 1.7–7.7)
Potassium: 4.3 meq/L (ref 3.5–5.3)
Sodium: 140 meq/L (ref 135–145)
Total Protein: 7.3 g/dL (ref 6.0–8.3)
WBC: 5.6 10*3/uL (ref 4.0–10.5)

## 2009-12-16 ENCOUNTER — Ambulatory Visit: Payer: Self-pay | Admitting: Internal Medicine

## 2010-01-28 ENCOUNTER — Ambulatory Visit: Payer: Self-pay | Admitting: Family Medicine

## 2010-02-11 ENCOUNTER — Ambulatory Visit: Payer: Self-pay | Admitting: Internal Medicine

## 2010-02-11 ENCOUNTER — Telehealth (INDEPENDENT_AMBULATORY_CARE_PROVIDER_SITE_OTHER): Payer: Self-pay | Admitting: *Deleted

## 2010-02-11 DIAGNOSIS — B370 Candidal stomatitis: Secondary | ICD-10-CM

## 2010-02-11 DIAGNOSIS — R61 Generalized hyperhidrosis: Secondary | ICD-10-CM

## 2010-02-11 DIAGNOSIS — J209 Acute bronchitis, unspecified: Secondary | ICD-10-CM | POA: Insufficient documentation

## 2010-02-21 ENCOUNTER — Telehealth: Payer: Self-pay | Admitting: Internal Medicine

## 2010-03-07 ENCOUNTER — Ambulatory Visit: Payer: Self-pay | Admitting: Internal Medicine

## 2010-03-07 LAB — CONVERTED CEMR LAB
ALT: 13 units/L (ref 0–53)
AST: 17 units/L (ref 0–37)
Alkaline Phosphatase: 81 units/L (ref 39–117)
Basophils Absolute: 0 10*3/uL (ref 0.0–0.1)
Basophils Relative: 0 % (ref 0–1)
Cholesterol: 126 mg/dL (ref 0–200)
Creatinine, Ser: 0.8 mg/dL (ref 0.40–1.50)
Eosinophils Absolute: 0.3 10*3/uL (ref 0.0–0.7)
Eosinophils Relative: 5 % (ref 0–5)
HCT: 49.9 % (ref 39.0–52.0)
HIV 1 RNA Quant: 104000 copies/mL — ABNORMAL HIGH (ref <20)
HIV-1 RNA Quant, Log: 5.02 — ABNORMAL HIGH (ref <1.30)
Hemoglobin: 16.7 g/dL (ref 13.0–17.0)
LDL Cholesterol: 49 mg/dL (ref 0–99)
Lymphocytes Relative: 45 % (ref 12–46)
MCHC: 33.5 g/dL (ref 30.0–36.0)
Monocytes Absolute: 0.3 10*3/uL (ref 0.1–1.0)
Platelets: 124 10*3/uL — ABNORMAL LOW (ref 150–400)
RDW: 14.8 % (ref 11.5–15.5)
Sodium: 141 meq/L (ref 135–145)
Total Bilirubin: 0.4 mg/dL (ref 0.3–1.2)
Total CHOL/HDL Ratio: 5.5
Total Protein: 7.2 g/dL (ref 6.0–8.3)
VLDL: 54 mg/dL — ABNORMAL HIGH (ref 0–40)

## 2010-03-08 ENCOUNTER — Encounter: Payer: Self-pay | Admitting: Internal Medicine

## 2010-03-08 LAB — CONVERTED CEMR LAB
ALT: 12 units/L (ref 0–53)
AST: 17 units/L (ref 0–37)
Albumin: 4 g/dL (ref 3.5–5.2)
BUN: 11 mg/dL (ref 6–23)
Basophils Absolute: 0 10*3/uL (ref 0.0–0.1)
Basophils Relative: 0 % (ref 0–1)
CO2: 26 meq/L (ref 19–32)
Calcium: 8.9 mg/dL (ref 8.4–10.5)
Chloride: 106 meq/L (ref 96–112)
Cholesterol: 126 mg/dL (ref 0–200)
Hemoglobin: 15.6 g/dL (ref 13.0–17.0)
Lymphocytes Relative: 43 % (ref 12–46)
MCHC: 33.9 g/dL (ref 30.0–36.0)
Monocytes Relative: 7 % (ref 3–12)
Neutro Abs: 2.8 10*3/uL (ref 1.7–7.7)
Neutrophils Relative %: 44 % (ref 43–77)
Potassium: 4.1 meq/L (ref 3.5–5.3)
RBC: 4.93 M/uL (ref 4.22–5.81)

## 2010-03-15 ENCOUNTER — Ambulatory Visit: Payer: Self-pay | Admitting: Internal Medicine

## 2010-05-03 ENCOUNTER — Ambulatory Visit: Payer: Self-pay | Admitting: Internal Medicine

## 2010-06-01 ENCOUNTER — Encounter (INDEPENDENT_AMBULATORY_CARE_PROVIDER_SITE_OTHER): Payer: Self-pay | Admitting: *Deleted

## 2010-06-01 ENCOUNTER — Encounter: Payer: Self-pay | Admitting: Internal Medicine

## 2010-06-01 ENCOUNTER — Ambulatory Visit
Admission: RE | Admit: 2010-06-01 | Discharge: 2010-06-01 | Payer: Self-pay | Source: Home / Self Care | Attending: Internal Medicine | Admitting: Internal Medicine

## 2010-06-01 LAB — CONVERTED CEMR LAB
ALT: 17 units/L (ref 0–53)
AST: 18 units/L (ref 0–37)
Albumin: 4.1 g/dL (ref 3.5–5.2)
Basophils Absolute: 0 10*3/uL (ref 0.0–0.1)
CO2: 29 meq/L (ref 19–32)
Calcium: 9.1 mg/dL (ref 8.4–10.5)
Chloride: 104 meq/L (ref 96–112)
Eosinophils Relative: 5 % (ref 0–5)
Lymphocytes Relative: 41 % (ref 12–46)
Neutro Abs: 2.9 10*3/uL (ref 1.7–7.7)
Neutrophils Relative %: 48 % (ref 43–77)
Platelets: 126 10*3/uL — ABNORMAL LOW (ref 150–400)
Potassium: 4.2 meq/L (ref 3.5–5.3)
RDW: 13.9 % (ref 11.5–15.5)
Total Protein: 7.4 g/dL (ref 6.0–8.3)

## 2010-06-02 LAB — T-HELPER CELL (CD4) - (RCID CLINIC ONLY)
CD4 % Helper T Cell: 17 % — ABNORMAL LOW (ref 33–55)
CD4 T Cell Abs: 430 uL (ref 400–2700)

## 2010-06-14 ENCOUNTER — Ambulatory Visit
Admission: RE | Admit: 2010-06-14 | Discharge: 2010-06-14 | Payer: Self-pay | Source: Home / Self Care | Attending: Internal Medicine | Admitting: Internal Medicine

## 2010-06-19 ENCOUNTER — Encounter: Payer: Self-pay | Admitting: Infectious Diseases

## 2010-06-30 NOTE — Assessment & Plan Note (Signed)
Summary: ?change of med/sorethroat/check skin cancer/cjr   Vital Signs:  Patient profile:   55 year old male Weight:      219 pounds BMI:     27.47 Temp:     98.2 degrees F oral BP sitting:   126 / 82  (left arm) Cuff size:   regular  Vitals Entered By: Raechel Ache, RN (July 30, 2009 10:18 AM) CC: Talk about meds, check spot R forehead, ?lump L neck and check toenails.   CC:  Talk about meds, check spot R forehead, and ?lump L neck and check toenails..  History of Present Illness: DICUSSION OF INCREASED AXIETHY AND THE EFFECT OF DEPRESSON IN THE INCREASE OF NEED FOR LORAZEPAM KNOT UNDER SKIN IN RIGHT CHIN AREA AK ON LEFT CJECK AT SITE OF PRIOR QUESTIONALBLE  bcca  FUNGAL NAIL WANT TO KNOW IF HE CAN TAKE ANTIFUNGALS FOR NAIL  Problems Prior to Update: 1)  Allergic Urticaria  (ICD-708.0) 2)  Panic Disorder  (ICD-300.01) 3)  Acute Maxillary Sinusitis  (ICD-461.0) 4)  Basal Cell Carcinoma, Nose  (ICD-173.3) 5)  Skin Lesion  (ICD-709.9) 6)  Testosterone Deficiency  (ICD-257.2) 7)  Physical Examination  (ICD-V70.0) 8)  Erectile Dysfunction, Organic  (ICD-607.84) 9)  Excessive Belching  (ICD-787.3) 10)  Abscess, Tooth  (ICD-522.5) 11)  Bronchitis, Chronic  (ICD-491.9) 12)  Bronchial Pneumonia  (ICD-485) 13)  Cough  (ICD-786.2) 14)  Dysphagia, Unspecified  (ICD-787.20) 15)  Smoker  (ICD-305.1) 16)  Onychomycosis, Toenails  (ICD-110.1) 17)  Spontaneous Pneumothorax  (ICD-512.8) 18)  Anxiety  (ICD-300.00) 19)  HIV Disease  (ICD-042)  Medications Prior to Update: 1)  Mirtazapine 15 Mg Tabs (Mirtazapine) .... One By Mouth  Q Hs 2)  Lorazepam 0.5 Mg Tabs (Lorazepam) .Marland Kitchen.. 1 -2 Daily As Needed  Current Medications (verified): 1)  Alprazolam 0.5 Mg Tabs (Alprazolam) .... One By Mouth Tid 2)  Lexapro 10 Mg Tabs (Escitalopram Oxalate) .... 1/2 By Mouth Daily in The Am  Allergies (verified): No Known Drug Allergies  Past History:  Family History: Last updated:  07/17/2008 No early CAD Anurysm hx in father's side father with ephasema  Social History: Last updated: 07/17/2008 Domestic Partner Current Smoker-1 ppd Alcohol use-yes Occupation:[employer]  Risk Factors: Alcohol Use: 0 (06/18/2009) Caffeine Use: 0 (05/07/2009) Exercise: yes (05/07/2009)  Risk Factors: Smoking Status: current (06/18/2009) Packs/Day: 1.0 (06/18/2009) Passive Smoke Exposure: yes (06/18/2009)  Past medical, surgical, family and social histories (including risk factors) reviewed, and no changes noted (except as noted below).  Past Medical History: Reviewed history from 07/17/2008 and no changes required. HIV disease, diagnosed in1997   ( primary did diagnosis, 1st care in  [facility]  with AZT then dual therapy with retrovir and another  stopped in 2001 and has had stable levels Anxiety Spontaneous pneumothorax Onychomycosis, tonails Smoker chronic  bronchitis possible ephasematous liung changes  Past Surgical History: Reviewed history from 07/17/2008 and no changes required. chest tube for colasped 1992  Family History: Reviewed history from 07/17/2008 and no changes required. No early CAD Anurysm hx in father's side father with ephasema  Social History: Reviewed history from 07/17/2008 and no changes required. Domestic Partner Current Smoker-1 ppd Alcohol use-yes Occupation:[employer]  Review of Systems  The patient denies anorexia, fever, weight loss, weight gain, vision loss, decreased hearing, hoarseness, chest pain, syncope, dyspnea on exertion, peripheral edema, prolonged cough, headaches, hemoptysis, abdominal pain, melena, hematochezia, severe indigestion/heartburn, hematuria, incontinence, genital sores, muscle weakness, suspicious skin lesions, transient blindness, difficulty walking,  depression, unusual weight change, abnormal bleeding, enlarged lymph nodes, angioedema, and breast masses.    Physical Exam  General:   Well-developed,well-nourished,in no acute distress; alert,appropriate and cooperative throughout examination Head:  normocephalic and atraumatic.   Eyes:  pupils equal and pupils round.   Ears:  R ear normal and L ear normal.   Nose:  External nasal examination shows no deformity or inflammation. Nasal mucosa are pink and moist without lesions or exudates. Mouth:  Oral mucosa and oropharynx without lesions or exudates.  Teeth in good repair. Neck:  No deformities,  1 CM CYST UNDER ANGLE OF RIGHT JAY  Lungs:  Normal respiratory effort, chest expands symmetrically. Lungs are clear to auscultation, no crackles or wheezes. Heart:  Normal rate and regular rhythm. S1 and S2 normal without gallop, murmur, click, rub or other extra sounds. Abdomen:  soft and no distention.   Prostate:  no gland enlargement and no nodules.   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 One by mouth  q hs    Lorazepam 0.5 Mg Tabs (Lorazepam) .Marland Kitchen... 1 -2 daily as needed His updated medication list for this problem includes:    Alprazolam 0.5 Mg Tabs (Alprazolam) ..... One by mouth tid    Lexapro 10 Mg Tabs (Escitalopram oxalate) .Marland Kitchen... 1/2 by mouth daily in the am  Discussed medication use and relaxation techniques.   Problem # 2:  ERECTILE DYSFUNCTION, ORGANIC (ICD-607.84)  Discussed proper use of medications, as well as side effects.   Problem # 3:  DERMATOPHYTOSIS OF NAIL (ICD-110.1)  Discussed nail care and medication treatment options.   Problem # 4:  SKIN LESION (ICD-709.9)  al OF LEFT TEMPLE WTH HX OF AK AND BCCA OF TEMPLE AT DERMATOLOGIST the lesion was identifies as a   ak or early bcca       and 40 seconds of cryotherapy with the liguid nitrogen  gun was apllied to the site. The pt tolerated the procedure and post procedure care was discussed  Orders: Cryotherapy/Destruction benign or premalignant lesion (1st lesion)  (17000)  Complete Medication List: 1)  Alprazolam 0.5 Mg Tabs (Alprazolam) .... One by mouth tid 2)  Lexapro 10 Mg Tabs (Escitalopram oxalate) .... 1/2 by mouth daily in the am  Patient Instructions: 1)  Please schedule a follow-up appointment in 6 weeks. Prescriptions: ALPRAZOLAM 0.5 MG TABS (ALPRAZOLAM) one by mouth TID  #90 x 3   Entered and Authorized by:   [name] MD   Signed by:   [name] MD on 07/30/2009   Method used:   Print then Give to Patient   RxID:   [id number]

## 2010-06-30 NOTE — Assessment & Plan Note (Signed)
Summary: 6WK F/U [MKJ]   Primary Provider:  [name] MD  CC:  follow-up visit and lab results.  History of Present Illness: Pt doing well except for recurent thrush,  Depression History:      The patient denies a depressed mood most of the day and a diminished interest in his usual daily activities.        The patient denies that he feels like life is not worth living, denies that he wishes that he were dead, and denies that he has thought about ending his life.        Preventive Screening-Counseling & Management  Alcohol-Tobacco     Alcohol drinks/day: 0     Alcohol type: AT TIMES -WINE     Smoking Status: current     Smoking Cessation Counseling: yes     Packs/Day: 1.0     Year Started: 1973     Passive Smoke Exposure: yes  Caffeine-Diet-Exercise     Caffeine use/day: 0     Does Patient Exercise: yes     Type of exercise: walking     Exercise (avg: min/session): 30-60     Times/week: 5  Hep-HIV-STD-Contraception     HIV Risk Counseling: 10/10/2004  Safety-Violence-Falls     Seat Belt Use: yes      Sexual History:  none.        Drug Use:  never.        Blood Transfusions:  no.        Travel History:  no.     Updated Prior Medication List: ALPRAZOLAM 0.5 MG TABS (ALPRAZOLAM) one by mouth TID LEXAPRO 10 MG TABS (ESCITALOPRAM OXALATE) one tablet one time a day FLUCONAZOLE 100 MG TABS (FLUCONAZOLE) one by mouth daily fo 14  Current Allergies (reviewed today): No known allergies  Past History:  Past Medical History: Last updated: 07/17/2008 HIV disease, diagnosed in1997   ( primary did diagnosis, 1st care in  [facility] hospital  with AZT then dual therapy with retrovir and another  stopped in 2001 and has had stable levels Anxiety Spontaneous pneumothorax Onychomycosis, tonails Smoker chronic  bronchitis possible ephasematous liung changes  Review of Systems  The patient denies anorexia, fever, and weight loss.    Vital Signs:  Patient profile:    55 year old male Height:      75 inches (190.50 cm) Weight:      219.8 pounds (99.91 kg) BMI:     27.57 Temp:     97.7 degrees F (36.50 degrees C) oral Pulse rate:   97 / minute BP sitting:   144 / 80  (left arm)  Vitals Entered By: Wendall Mola CMA Duncan Dull) (June 14, 2010 2:34 PM) CC: follow-up visit, lab results Is Patient Diabetic? No Pain Assessment Patient in pain? no      Nutritional Status BMI of 25 - 29 = overweight Nutritional Status Detail appetite "too good"  Have you ever been in a relationship where you felt threatened, hurt or afraid?No   Does patient need assistance? Functional Status Self care Ambulation Normal   Physical Exam  General:  alert, well-developed, well-nourished, and well-hydrated.   Head:  normocephalic and atraumatic.   Mouth:  pharynx pink and moist.  thrush present Lungs:  normal breath sounds.      Impression & Recommendations:  Problem # 1:  HIV DISEASE (ICD-042) Pt currently does not wish to be on HIV meds Diagnostics Reviewed:  HIV: HIV positive - not AIDS (05/07/2009)  CD4: 430 (06/02/2010)   WBC: 6.0 (06/01/2010)   Hgb: 15.8 (06/01/2010)   HCT: 46.5 (06/01/2010)   Platelets: 126 (06/01/2010) HIV genotype: See Comment (06/01/2010)   HIV-1 RNA: 83900 (06/01/2010)   HBSAg: NO (07/23/2006)  Problem # 2:  CANDIDIASIS, ORAL (ICD-112.0) fluconazole  Medications Added to Medication List This Visit: 1)  Fluconazole 100 Mg Tabs (Fluconazole) .... One by mouth daily fo 14  Other Orders: Est. Patient Level III (16109) Future Orders: T-CD4SP ([facility]) (CD4SP) ... 09/12/2010 T-HIV Viral Load [id number]) ... 09/12/2010 T-Comprehensive Metabolic Panel [id number]) ... 09/12/2010 T-CBC w/Diff ([id number]) ... 09/12/2010  Patient Instructions: 1)  Please schedule a follow-up appointment in 3 months, 2 weeks after labs.  Prescriptions: FLUCONAZOLE 100 MG TABS (FLUCONAZOLE) one by mouth daily fo 14  #14 x 0   Entered and  Authorized by:   Yisroel Ramming MD   Signed by:   Yisroel Ramming MD on 06/14/2010   Method used:   Print then Give to Patient   RxID:   [id number]

## 2010-06-30 NOTE — Assessment & Plan Note (Signed)
Summary: sick[mkj]   Primary Provider:  [name] MD  CC:  pt. c/o thrush and sorethroat and left ear pain.  History of Present Illness: Pt has been treated 2 times for thrush and now he thinks it is back again. He c/o rawness of the inside of his mouth and sore tongue. He was not on natibiotics prior to or during his thrush outbreaks. He was treated with clotrimazole tabs the first time and fluconazole the second time.  Depression History:      The patient denies a depressed mood most of the day and a diminished interest in his usual daily activities.        The patient denies that he feels like life is not worth living, denies that he wishes that he were dead, and denies that he has thought about ending his life.        Preventive Screening-Counseling & Management  Alcohol-Tobacco     Alcohol drinks/day: 0     Alcohol type: AT TIMES -WINE     Smoking Status: current     Smoking Cessation Counseling: yes     Packs/Day: 1.0     Year Started: 1973     Passive Smoke Exposure: yes  Caffeine-Diet-Exercise     Caffeine use/day: 0     Does Patient Exercise: yes     Type of exercise: walking     Exercise (avg: min/session): 30-60     Times/week: 5  Hep-HIV-STD-Contraception     HIV Risk Counseling: 10/10/2004  Safety-Violence-Falls     Seat Belt Use: yes      Sexual History:  none.        Drug Use:  never.        Blood Transfusions:  no.        Travel History:  no.     Updated Prior Medication List: ALPRAZOLAM 0.5 MG TABS (ALPRAZOLAM) one by mouth TID LEXAPRO 10 MG TABS (ESCITALOPRAM OXALATE) one tablet one time a day BROMFED DM 30-2-10 MG/5ML SYRP (PSEUDOEPH-BROMPHEN-DM) two tsp by mouth three times a day as needed FLUCONAZOLE 100 MG TABS (FLUCONAZOLE) one by mouth daily for 14 day  Current Allergies (reviewed today): No known allergies  Past History:  Past Medical History: Last updated: 07/17/2008 HIV disease, diagnosed in1997   ( primary did diagnosis, 1st  care in  [facility]  with AZT then dual therapy with retrovir and another  stopped in 2001 and has had stable levels Anxiety Spontaneous pneumothorax Onychomycosis, tonails Smoker chronic  bronchitis possible ephasematous liung changes  Review of Systems  The patient denies fever, weight loss, and dyspnea on exertion.    Vital Signs:  Patient profile:   55 year old male Height:      75 inches (190.50 cm) Weight:      214.12 pounds (97.33 kg) BMI:     26.86 Temp:     98.0 degrees F (36.67 degrees C) oral Pulse rate:   75 / minute BP sitting:   119 / 73  (right arm)  Vitals Entered By: Wendall Mola CMA Duncan Dull) (March 15, 2010 3:01 PM) CC: pt. c/o thrush, sorethroat and left ear pain Is Patient Diabetic? No Pain Assessment Patient in pain? yes     Location: throat Intensity: 3 Type: sore Onset of pain  Intermittent Nutritional Status BMI of 25 - 29 = overweight Nutritional Status Detail appetite "fine"  Does patient need assistance? Functional Status Self care Ambulation Normal   Physical Exam  General:  alert, well-developed, well-nourished, and well-hydrated.   Head:  normocephalic and atraumatic.   Ears:  R ear normal and L ear normal.   Mouth:  poor dentition.  thrush present   Impression & Recommendations:  Problem # 1:  CANDIDIASIS, ORAL (ICD-112.0) recurrent will treat with fluconazole discussed start HIV medications since this represents a weakened immune system - pt will think about it  Problem # 2:  HIV DISEASE (ICD-042) Pt will think about starting antiretroviral therapy. Diagnostics Reviewed:  HIV: HIV positive - not AIDS (05/07/2009)   CD4: 490 (03/08/2010)   WBC: 6.2 (03/08/2010)   Hgb: 15.6 (03/08/2010)   HCT: 46.0 (03/08/2010)   Platelets: 130 (03/08/2010) HIV-1 RNA: 104000 (03/07/2010)   HBSAg: NO (07/23/2006)  Other Orders: Influenza Vaccine NON MCR (96295) Prescriptions: FLUCONAZOLE 100 MG TABS (FLUCONAZOLE) one by  mouth daily for 14 day  #14 x 0   Entered and Authorized by:   Yisroel Ramming MD   Signed by:   Yisroel Ramming MD on 03/15/2010   Method used:   Print then Give to Patient   RxID:   [id number]      Immunizations Administered:  Influenza Vaccine # 1:    Vaccine Type: Fluvax Non-MCR    Site: right deltoid    Mfr: Novartis    Dose: 0.5 ml    Route: IM    Given by: Wendall Mola CMA ( AAMA)    Exp. Date: 08/28/2010    Lot #: [id number]    VIS given: 12/21/09 version given March 15, 2010.  Flu Vaccine Consent Questions:    Do you have a history of severe allergic reactions to this vaccine? no    Any prior history of allergic reactions to egg and/or gelatin? no    Do you have a sensitivity to the preservative Thimersol? no    Do you have a past history of Guillan-Barre Syndrome? no    Do you currently have an acute febrile illness? no    Have you ever had a severe reaction to latex? no    Vaccine information given and explained to patient? yes

## 2010-06-30 NOTE — Assessment & Plan Note (Signed)
Summary: COUGH,CONGESTION,FEVER,ST // RS   Vital Signs:  Patient profile:   55 year old male Height:      75 inches Weight:      216 pounds BMI:     27.10 Temp:     98.6 degrees F rectal Pulse rate:   76 / minute Resp:     14 per minute BP sitting:   120 / 78  (left arm)  Vitals Entered By: Willy Eddy, LPN (June 18, 2009 11:21 AM) CC: sorethroat on and off for about a month-cant completely clear up-- to dentist yesterday and they told him he had blisters in his mouth   CC:  sorethroat on and off for about a month-cant completely clear up-- to dentist yesterday and they told him he had blisters in his mouth.  History of Present Illness: Took z cam abput one month ago and was able to prevent a cold, but has recurrent sore throat and hoaseness Dental assistant noted sores iin mouthThe pt has not had any fevers or chills and has not develped a productive couhg he is Hiv pos cd4 was 400 at last check  Preventive Screening-Counseling & Management  Alcohol-Tobacco     Alcohol drinks/day: 0     Alcohol type: AT TIMES -WINE     Smoking Status: current     Smoking Cessation Counseling: yes     Packs/Day: 1.0     Year Started: 1973     Passive Smoke Exposure: yes  Problems Prior to Update: 1)  Panic Disorder  (ICD-300.01) 2)  Acute Maxillary Sinusitis  (ICD-461.0) 3)  Basal Cell Carcinoma, Nose  (ICD-173.3) 4)  Skin Lesion  (ICD-709.9) 5)  Testosterone Deficiency  (ICD-257.2) 6)  Physical Examination  (ICD-V70.0) 7)  Erectile Dysfunction, Organic  (ICD-607.84) 8)  Excessive Belching  (ICD-787.3) 9)  Abscess, Tooth  (ICD-522.5) 10)  Bronchitis, Chronic  (ICD-491.9) 11)  Bronchial Pneumonia  (ICD-485) 12)  Cough  (ICD-786.2) 13)  Dysphagia, Unspecified  (ICD-787.20) 14)  Smoker  (ICD-305.1) 15)  Onychomycosis, Toenails  (ICD-110.1) 16)  Spontaneous Pneumothorax  (ICD-512.8) 17)  Anxiety  (ICD-300.00) 18)  HIV Disease  (ICD-042)  Medications Prior to Update: 1)   Depo-Testosterone 200 Mg/ml Oil (Testosterone Cypionate) .... 1/2cc Every Two Weeks 2)  Mirtazapine 15 Mg Tabs (Mirtazapine) .... One By Mouth  Q Hs 3)  Lorazepam 0.5 Mg Tabs (Lorazepam) .Marland Kitchen.. 1 -2 Daily As Needed  Current Medications (verified): 1)  Mirtazapine 15 Mg Tabs (Mirtazapine) .... One By Mouth  Q Hs 2)  Lorazepam 0.5 Mg Tabs (Lorazepam) .Marland Kitchen.. 1 -2 Daily As Needed 3)  Sulfamethoxazole-Tmp Ds 800-160 Mg Tabs (Sulfamethoxazole-Trimethoprim) .... One By Mouth Two Times A Day 4)  Bromfed Dm 30-2-10 Mg/47ml Syrp (Pseudoeph-Bromphen-Dm) .... Two Tsp By Mouth Q 4 Hours  Allergies (verified): No Known Drug Allergies  Past History:  Family History: Last updated: 07/17/2008 No early CAD Anurysm hx in father's side father with ephasema  Social History: Last updated: 07/17/2008 Domestic Partner Current Smoker-1 ppd Alcohol use-yes Occupation:[employer]  Risk Factors: Alcohol Use: 0 (06/18/2009) Caffeine Use: 0 (05/07/2009) Exercise: yes (05/07/2009)  Risk Factors: Smoking Status: current (06/18/2009) Packs/Day: 1.0 (06/18/2009) Passive Smoke Exposure: yes (06/18/2009)  Past medical, surgical, family and social histories (including risk factors) reviewed, and no changes noted (except as noted below).  Past Medical History: Reviewed history from 07/17/2008 and no changes required. HIV disease, diagnosed in1997   ( primary did diagnosis, 1st care in  [facility] hospital  with AZT  then dual therapy with retrovir and another  stopped in 2001 and has had stable levels Anxiety Spontaneous pneumothorax Onychomycosis, tonails Smoker chronic  bronchitis possible ephasematous liung changes  Past Surgical History: Reviewed history from 07/17/2008 and no changes required. chest tube for colasped 1992  Family History: Reviewed history from 07/17/2008 and no changes required. No early CAD Anurysm hx in father's side father with ephasema  Social History: Reviewed history from  07/17/2008 and no changes required. Domestic Partner Current Smoker-1 ppd Alcohol use-yes Occupation:[employer]  Review of Systems       The patient complains of hoarseness and prolonged cough.  The patient denies anorexia, fever, weight loss, weight gain, vision loss, decreased hearing, chest pain, syncope, dyspnea on exertion, peripheral edema, headaches, hemoptysis, abdominal pain, melena, hematochezia, severe indigestion/heartburn, hematuria, incontinence, genital sores, muscle weakness, suspicious skin lesions, transient blindness, difficulty walking, depression, unusual weight change, abnormal bleeding, enlarged lymph nodes, angioedema, breast masses, and testicular masses.    Physical Exam  General:  alert, well-developed, well-nourished, and well-hydrated.   Head:  normocephalic and atraumatic.   Eyes:  pupils equal and pupils round.   Nose:  no external deformity and no nasal discharge.   Mouth:  pharynx pink and moist.   Neck:  No deformities, masses, or tenderness noted. Lungs:  normal respiratory effort and no wheezes.   Heart:  normal rate, regular rhythm, and bradycardia.   Abdomen:  Bowel sounds positive,abdomen soft and non-tender without masses, organomegaly or hernias noted. Msk:  No deformity or scoliosis noted of thoracic or lumbar spine.   Pulses:  R and L carotid,radial,femoral,dorsalis pedis and posterior tibial pulses are full and equal bilaterally Extremities:  No clubbing, cyanosis, edema, or deformity noted with normal full range of motion of all joints.   Neurologic:  No cranial nerve deficits noted. Station and gait are normal. Plantar reflexes are down-going bilaterally. DTRs are symmetrical throughout. Sensory, motor and coordinative functions appear intact. Skin:  basal cell ca on left nare   Impression & Recommendations:  Problem # 1:  ACUTE MAXILLARY SINUSITIS (ICD-461.0)  Instructed on treatment. Call if symptoms persist or worsen.   His updated  medication list for this problem includes:    Sulfamethoxazole-tmp Ds 800-160 Mg Tabs (Sulfamethoxazole-trimethoprim) ..... One by mouth two times a day    Bromfed Dm 30-2-10 Mg/58ml Syrp (Pseudoeph-bromphen-dm) .Marland Kitchen..Marland Kitchen Two tsp by mouth q 4 hours  Complete Medication List: 1)  Mirtazapine 15 Mg Tabs (Mirtazapine) .... One by mouth  q hs 2)  Lorazepam 0.5 Mg Tabs (Lorazepam) .Marland Kitchen.. 1 -2 daily as needed 3)  Sulfamethoxazole-tmp Ds 800-160 Mg Tabs (Sulfamethoxazole-trimethoprim) .... One by mouth two times a day 4)  Bromfed Dm 30-2-10 Mg/49ml Syrp (Pseudoeph-bromphen-dm) .... Two tsp by mouth q 4 hours  Patient Instructions: 1)  Take your antibiotic as prescribed until ALL of it is gone, but stop if you develop a rash or swelling and contact our office as soon as possible. Prescriptions: BROMFED DM 30-2-10 MG/5ML SYRP (PSEUDOEPH-BROMPHEN-DM) two tsp by mouth q 4 hours  #6 oz x 1   Entered and Authorized by:   [name] MD   Signed by:   [name] MD on 06/18/2009   Method used:   Electronically to        [facility] [phone]* (retail)       [address] [place], [place]  [address]  Ph: [phone] or [phone]       Fax: [phone]   RxID:   [id number] SULFAMETHOXAZOLE-TMP DS 800-160 MG TABS (SULFAMETHOXAZOLE-TRIMETHOPRIM) one by mouth two times a day  #20 x 0   Entered and Authorized by:   [name] MD   Signed by:   [name] MD on 06/18/2009   Method used:   Electronically to        [facility] [phone]* (retail)       [address] [place], [place]  [address]       Ph: [phone] or [phone]       Fax: [phone]   RxID:   [id number]   Appended Document: COUGH,CONGESTION,FEVER,ST // RS     Allergies: No Known Drug Allergies  Physical Exam  Nose:  nasal dischargemucosal pallor, mucosal edema, and airflow obstruction.   Mouth:  posterior lymphoid hypertrophy and postnasal drip.     Complete Medication List: 1)   Mirtazapine 15 Mg Tabs (Mirtazapine) .... One by mouth  q hs 2)  Lorazepam 0.5 Mg Tabs (Lorazepam) .Marland Kitchen.. 1 -2 daily as needed 3)  Sulfamethoxazole-tmp Ds 800-160 Mg Tabs (Sulfamethoxazole-trimethoprim) .... One by mouth two times a day 4)  Bromfed Dm 30-2-10 Mg/92ml Syrp (Pseudoeph-bromphen-dm) .... Two tsp by mouth q 4 hours

## 2010-06-30 NOTE — Progress Notes (Signed)
Summary: congestion  Phone Note Call from Patient   Caller: Patient Call For: [name] MD Summary of Call: Pt feels like he has fever, chills and cough x 2 weeks.  Productive green cough.  Non URI symptoms. [facility] [phone] Initial call taken by: Lynann Beaver CMA,  February 11, 2010 8:43 AM  Follow-up for Phone Call        may see dr Lovell Sheehan at 11:30 Follow-up by: Willy Eddy, LPN,  February 11, 2010 9:40 AM  Additional Follow-up for Phone Call Additional follow up Details #1::        notified pt. Additional Follow-up by: Lynann Beaver CMA,  February 11, 2010 9:42 AM

## 2010-06-30 NOTE — Progress Notes (Signed)
Summary: REQ FOR APPT / MEDS  Phone Note Call from Patient   Caller: Patient [phone] Reason for Call: Talk to Nurse, Talk to Doctor Summary of Call: Pt called in to schedule appt with Dr Lovell Sheehan for today / tomorrow ref to c/o congestion, cough, fever, sore throat..... Pt adv that he has tried OTC meds w/ little relief.... Pt was adv that he could come in for appt w/ another physician but pt req to see Dr Lovell Sheehan... Pt req that if he can't be seen today / tomorrow, Is there anyway that med could be called in to [facility] on [address].... Can you advise?     Pt can be reached at  [phone] with any questions or concerns.  Initial call taken by: Debbra Riding,  June 17, 2009 11:13 AM  Follow-up for Phone Call        may come tomorrow -friday- at 11 am Follow-up by: Willy Eddy, LPN,  June 17, 2009 11:18 AM  Additional Follow-up for Phone Call Additional follow up Details #1::        Phone Call Completed------Pt contacted and appt scheduled for tomorrow, Friday - 06/18/2009 @ 11am - per Rushie Goltz, LPN.

## 2010-06-30 NOTE — Assessment & Plan Note (Signed)
Summary: thrush?/dm   Vital Signs:  Patient profile:   55 year old male Temp:     98.0 degrees F oral BP sitting:   118 / 78  (left arm) Cuff size:   regular  Vitals Entered By: Sid Falcon LPN (January 28, 2010 11:06 AM)  History of Present Illness: Patient seen with intraoral rash.  Patient is HIV positive. His counts have been stable.  Saw dentist Tuesday and was diagnosed with thrush. Started on Mycelex troche which she is using 5 times daily. Here today with mild sore throat but no odynophagia. No fever or chills. No recent antibiotics. No recent prednisone use. No history of diabetes or any symptoms of hyperglycemia.  he does still smoke.  Allergies (verified): No Known Drug Allergies  Past History:  Past Medical History: Last updated: 07/17/2008 HIV disease, diagnosed in1997   ( primary did diagnosis, 1st [facility] in  [facility]  with AZT then dual therapy with retrovir and another  stopped in 2001 and has had stable levels Anxiety Spontaneous pneumothorax Onychomycosis, tonails Smoker chronic  bronchitis possible ephasematous liung changes  Social History: Last updated: 07/17/2008 Domestic Partner Current Smoker-1 ppd Alcohol use-yes Occupation:[employer] PMH reviewed for relevance, SH/Risk Factors reviewed for relevance  Physical Exam  General:  Well-developed,well-nourished,in no acute distress; alert,appropriate and cooperative throughout examination Mouth:  patient has a few very small nonspecific erythematous patches right buccal mucosa and posterior pharynx. No ulcerations. No exudate. No whitish discoloration at this time Neck:  No deformities, masses, or tenderness noted. Lungs:  Normal respiratory effort, chest expands symmetrically. Lungs are clear to auscultation, no crackles or wheezes. Heart:  normal rate and regular rhythm.     Impression & Recommendations:  Problem # 1:  THRUSH (ICD-112.0) possible recent thrush. This appears to be  resolving. Continue Mycelex throat for a full 7-10 days.  Complete Medication List: 1)  Alprazolam 0.5 Mg Tabs (Alprazolam) .... One by mouth tid 2)  Lexapro 10 Mg Tabs (Escitalopram oxalate) .... One tablet one time a day

## 2010-06-30 NOTE — Miscellaneous (Signed)
Summary: RW Financial Update   Clinical Lists Changes  Observations: Added new observation of YEARLYEXPEN: 2100  (06/01/2010 11:35) Added new observation of FINASSESSDT: 06/01/2010  (06/01/2010 11:35) Added new observation of PCTFPL: 302.15  (06/01/2010 11:35) Added new observation of HOUSEINCOME: 51761  (06/01/2010 11:35)

## 2010-06-30 NOTE — Progress Notes (Signed)
Summary: FYI  Phone Note Call from Patient Call back at [facility] Phone [phone] Call back at [phone]   Caller: Patient Call For: [name] MD Reason for Call: Acute Illness Summary of Call: FYI PT NO LONGER HAVING NIGHT SWEATS Initial call taken by: Heron Sabins,  February 21, 2010 11:31 AM  Follow-up for Phone Call        dr Lovell Sheehan informed Follow-up by: Willy Eddy, LPN,  February 21, 2010 11:42 AM

## 2010-06-30 NOTE — Assessment & Plan Note (Signed)
Summary: f/u ov/tkk   CC:  follow-up visit and lab results.  History of Present Illness: Pt here for f/u.  He has been suffering from panic attacks due to stress at work.  He has a small lump on left side of neck that he would like to have looked at.  Depression History:      The patient denies a depressed mood most of the day and a diminished interest in his usual daily activities.        The patient denies that he feels like life is not worth living, denies that he wishes that he were dead, and denies that he has thought about ending his life.        Preventive Screening-Counseling & Management  Alcohol-Tobacco     Alcohol drinks/day: 0     Alcohol type: AT TIMES -WINE     Smoking Status: current     Smoking Cessation Counseling: yes     Packs/Day: 1.0     Year Started: 1973     Passive Smoke Exposure: yes  Caffeine-Diet-Exercise     Caffeine use/day: 0     Does Patient Exercise: yes     Type of exercise: walking     Exercise (avg: min/session): 30-60     Times/week: 5  Hep-HIV-STD-Contraception     HIV Risk Counseling: 10/10/2004  Safety-Violence-Falls     Seat Belt Use: yes      Sexual History:  none.        Drug Use:  never.        Blood Transfusions:  no.        Travel History:  no.     Updated Prior Medication List: ALPRAZOLAM 0.5 MG TABS (ALPRAZOLAM) one by mouth TID LEXAPRO 10 MG TABS (ESCITALOPRAM OXALATE) 1/2 by mouth daily in the am  Current Allergies (reviewed today): No known allergies  Past History:  Past Medical History: Last updated: 07/17/2008 HIV disease, diagnosed in1997   ( primary did diagnosis, 1st care in  [facility] hospital  with AZT then dual therapy with retrovir and another  stopped in 2001 and has had stable levels Anxiety Spontaneous pneumothorax Onychomycosis, tonails Smoker chronic  bronchitis possible ephasematous liung changes  Review of Systems  The patient denies anorexia, fever, and weight loss.    Vital  Signs:  Patient profile:   55 year old male Height:      75 inches (190.50 cm) Weight:      216.8 pounds (98.55 kg) BMI:     27.20 Temp:     98.2 degrees F (36.78 degrees C) oral Pulse rate:   73 / minute BP sitting:   120 / 73  (right arm)  Vitals Entered By: Wendall Mola CMA Duncan Dull) (September 10, 2009 2:10 PM) CC: follow-up visit, lab results Is Patient Diabetic? No Pain Assessment Patient in pain? no      Nutritional Status BMI of 25 - 29 = overweight Nutritional Status Detail appetite "good"  Does patient need assistance? Functional Status Self care Ambulation Normal Comments no missed doses of meds per patient   Physical Exam  General:  alert, well-developed, well-nourished, and well-hydrated.   Head:  normocephalic and atraumatic.   Mouth:  pharynx pink and moist.   Neck:  small dime sized node or cyst one left side of neck Lungs:  normal breath sounds.      Impression & Recommendations:  Problem # 1:  HIV DISEASE (ICD-042) Pt currently not on therapy per his choice.  CD4ct stable. He will return in 3 months for labs. Diagnostics Reviewed:  HIV: HIV positive - not AIDS (05/07/2009)   CD4: 470 (09/01/2009)   WBC: 5.4 (08/31/2009)   Hgb: 15.1 (08/31/2009)   HCT: 44.3 (08/31/2009)   Platelets: 131 (08/31/2009) HIV-1 RNA: 35600 (08/31/2009)   HBSAg: NO (07/23/2006)  Other Orders: Est. Patient Level III (04540) Future Orders: T-CD4SP ([facility]) (CD4SP) ... 12/09/2009 T-HIV Viral Load [phone]) ... 12/09/2009 T-Comprehensive Metabolic Panel [phone]) ... 12/09/2009 T-CBC w/Diff ([id number]) ... 12/09/2009 T-RPR (Syphilis) [phone]) ... 12/09/2009  Patient Instructions: 1)  Please schedule a follow-up appointment in 3 months, 2 weeks after labs.

## 2010-06-30 NOTE — Miscellaneous (Signed)
Summary: Appointment Canceled  Appointment status changed to canceled by [employer] on 08/31/2009 2:37 PM.  Cancellation Comments --------------------- 42month f/u/vs  Appointment Information ----------------------- Appt Type:  ID OFFICE VISIT      Date:  Friday, September 03, 2009      Time:  2:30 PM for 15 min   Urgency:  Routine   Made By:  Hoy Finlay Scheduler  To Visit:  James Terrell    Reason:  42month f/u/vs  Appt Comments ------------- -- 08/31/09 14:37: ([facility]) CANCELED -- 42month f/u/vs -- 08/20/09 12:05: ([facility]) BOOKED -- Routine ID OFFICE VISIT at 09/03/2009 2:30 PM for 15 min 42month f/u/vs

## 2010-06-30 NOTE — Assessment & Plan Note (Signed)
Summary: TALK ABOUT MEDS/VS   Primary James Terrell:  James Glaze MD  CC:  pt. wants to discuss meds and thrush seems to keep recurring.  History of Present Illness: patient has been getting recurrent sore throats and is concerned that he is getting recurrent thrush infections.  He has not seen any white discharge in his throat.  He continues to want to avoid starting antiretroviral therapy.  He is willing to come in in January to repeat his blood work and if his CD4 count has dropped he would consider an antiretroviral therapy.  I gave him some information regarding atripla and he will  look into his insurance co-pay for that medication.  Depression History:      The patient denies a depressed mood most of the day and a diminished interest in his usual daily activities.        The patient denies that he feels like life is not worth living, denies that he wishes that he were dead, and denies that he has thought about ending his life.        Preventive Screening-Counseling & Management  Alcohol-Tobacco     Alcohol drinks/day: 0     Alcohol type: AT TIMES -WINE     Smoking Status: current     Smoking Cessation Counseling: yes     Packs/Day: 1.0     Year Started: 1973     Passive Smoke Exposure: yes  Caffeine-Diet-Exercise     Caffeine use/day: 0     Does Patient Exercise: yes     Type of exercise: walking     Exercise (avg: min/session): 30-60     Times/week: 5  Hep-HIV-STD-Contraception     HIV Risk Counseling: 10/10/2004  Safety-Violence-Falls     Seat Belt Use: yes      Sexual History:  none.        Drug Use:  never.        Blood Transfusions:  no.        Travel History:  no.     Updated Prior Medication List: ALPRAZOLAM 0.5 MG TABS (ALPRAZOLAM) one by mouth TID LEXAPRO 10 MG TABS (ESCITALOPRAM OXALATE) one tablet one time a day BROMFED DM 30-2-10 MG/5ML SYRP (PSEUDOEPH-BROMPHEN-DM) two tsp by mouth three times a day as needed FLUCONAZOLE 100 MG TABS (FLUCONAZOLE) one  by mouth daily for 14 day  Current Allergies (reviewed today): No known allergies  Past History:  Past Medical History: Last updated: 07/17/2008 HIV disease, diagnosed in1997   ( primary did diagnosis, 1st care in  [facility]  with AZT then dual therapy with retrovir and another  stopped in 2001 and has had stable levels Anxiety Spontaneous pneumothorax Onychomycosis, tonails Smoker chronic  bronchitis possible ephasematous liung changes  Review of Systems  The patient denies anorexia, fever, and weight loss.    Vital Signs:  Patient profile:   55 year old male Height:      75 inches (190.50 cm) Weight:      217.7 pounds (98.95 kg) BMI:     27.31 Temp:     98.6 degrees F (37.00 degrees C) oral Pulse rate:   90 / minute BP sitting:   126 / 79  (right arm)  Vitals Entered By: Wendall Mola CMA Duncan Dull) (May 03, 2010 2:44 PM) CC: pt. wants to discuss meds, thrush seems to keep recurring Is Patient Diabetic? No Pain Assessment Patient in pain? no      Nutritional Status BMI of 25 -  29 = overweight Nutritional Status Detail appetite "fine"  Have you ever been in a relationship where you felt threatened, hurt or afraid?No   Does patient need assistance? Functional Status Self care Ambulation Normal Comments no missed doses of meds per pt.   Physical Exam  General:  alert, well-developed, well-nourished, and well-hydrated.   Head:  normocephalic and atraumatic.   Mouth:  pharynx pink and moist.   Lungs:  normal breath sounds.     Impression & Recommendations:  Problem # 1:  CANDIDIASIS, ORAL (ICD-112.0) none today sore throat may be due to allergies and post-nasal drip  Problem # 2:  HIV DISEASE (ICD-042) currently not on therapy.  will repeat labs in  January. Orders: Est. Patient Level III (99213)Future Orders: T-CBC w/Diff ([id number]) ... 05/31/2010 T-CD4SP ([facility]) (CD4SP) ... 05/31/2010 T-Comprehensive Metabolic Panel  ([id number]) ... 05/31/2010 T-HIV Genotype ([id number]) ... 05/31/2010 T-HIV Viral Load [phone]) ... 05/31/2010 T-HLA-B*5701 ([id number]) ... 05/31/2010  Patient Instructions: 1)  Please schedule a follow-up appointment in 6 weeks, 2 weeks after labs.

## 2010-06-30 NOTE — Letter (Signed)
Summary: [facility]  [facility]   Imported By: Maryln Gottron 08/10/2009 14:29:13  _____________________________________________________________________  External Attachment:    Type:   Image     Comment:   External Document

## 2010-06-30 NOTE — Assessment & Plan Note (Signed)
Summary: rash on back and back of knees/itches/cjr   Vital Signs:  Patient profile:   55 year old male Temp:     98.5 degrees F oral BP sitting:   140 / 84  (left arm) Cuff size:   regular  Vitals Entered By: Sid Falcon LPN (July 06, 2009 9:59 AM) CC: Itiching all over X 2 days, origin unknown   History of Present Illness: Acute visit for pruritic rash back lower extremities and arms which started Sunday night.  not sure what may have precipitated but he recalls eating raspberries late Saturday. No history of prior food allergy. No recent med changes. No change in soaps or detergent. Heat may exacerbate. Has not taken any antihistamines or other medications. Was on antibiotics but this was a few weeks ago and had no rash whatsoever until at least a week and after finishing antibiotics.   no recent fever or chills  Allergies (verified): No Known Drug Allergies  Past History:  Past Medical History: Last updated: 07/17/2008 HIV disease, diagnosed in1997   ( primary did diagnosis, 1st care in  [facility] hospital  with AZT then dual therapy with retrovir and another  stopped in 2001 and has had stable levels Anxiety Spontaneous pneumothorax Onychomycosis, tonails Smoker chronic  bronchitis possible ephasematous liung changes PMH reviewed for relevance  Review of Systems      See HPI  Physical Exam  General:  Well-developed,well-nourished,in no acute distress; alert,appropriate and cooperative throughout examination Nose:  External nasal examination shows no deformity or inflammation. Nasal mucosa are pink and moist without lesions or exudates. Mouth:  Oral mucosa and oropharynx without lesions or exudates.  Teeth in good repair. Neck:  No deformities, masses, or tenderness noted. Lungs:  Normal respiratory effort, chest expands symmetrically. Lungs are clear to auscultation, no crackles or wheezes. Heart:  Normal rate and regular rhythm. S1 and S2 normal without gallop,  murmur, click, rub or other extra sounds. Skin:  patient has minimally raised erythematous blanching rash which is confluent upper arms and scattered over the back region. 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 etiology.  Try Zyrtec and add pepcid or zantac as needed.  Complete Medication List: 1)  Mirtazapine 15 Mg Tabs (Mirtazapine) .... One by mouth  q hs 2)  Lorazepam 0.5 Mg Tabs (Lorazepam) .Marland Kitchen.. 1 -2 daily as needed  Patient Instructions: 1)  Try over-the-counter Zyrtec 2)  May add Pepcid or Zantac as needed. 3)  F/U with Dr Lovell Sheehan in 3-4 days if no better.

## 2010-06-30 NOTE — Assessment & Plan Note (Signed)
Summary: 2WK F/U/VS   CC:  follow-up visit and lab results.  History of Present Illness: Pt here for f/u.  He had a cough a week or so ago and took some antibiotics that he had at his house and it cleared up. Pt continues to struggle with anxiety and is seeing a therapist.  Depression History:      The patient denies a depressed mood most of the day and a diminished interest in his usual daily activities.        The patient denies that he feels like life is not worth living, denies that he wishes that he were dead, and denies that he has thought about ending his life.        Preventive Screening-Counseling & Management  Alcohol-Tobacco     Alcohol drinks/day: 0     Alcohol type: AT TIMES -WINE     Smoking Status: current     Smoking Cessation Counseling: yes     Packs/Day: 1.0     Year Started: 1973     Passive Smoke Exposure: yes  Caffeine-Diet-Exercise     Caffeine use/day: 0     Does Patient Exercise: yes     Type of exercise: walking     Exercise (avg: min/session): 30-60     Times/week: 5  Hep-HIV-STD-Contraception     HIV Risk Counseling: 10/10/2004  Safety-Violence-Falls     Seat Belt Use: yes      Sexual History:  none.        Drug Use:  never.        Blood Transfusions:  no.        Travel History:  no.     Updated Prior Medication List: ALPRAZOLAM 0.5 MG TABS (ALPRAZOLAM) one by mouth TID LEXAPRO 10 MG TABS (ESCITALOPRAM OXALATE) one tablet one time a day  Current Allergies (reviewed today): No known allergies  Past History:  Past Medical History: Last updated: 07/17/2008 HIV disease, diagnosed in1997   ( primary did diagnosis, 1st care in  [facility] hospital  with AZT then dual therapy with retrovir and another  stopped in 2001 and has had stable levels Anxiety Spontaneous pneumothorax Onychomycosis, tonails Smoker chronic  bronchitis possible ephasematous liung changes  Review of Systems  The patient denies anorexia, fever, and weight loss.      Vital Signs:  Patient profile:   55 year old male Height:      75 inches (190.50 cm) Weight:      210.12 pounds (95.51 kg) BMI:     26.36 Temp:     98.1 degrees F (36.72 degrees C) oral Pulse rate:   73 / minute BP sitting:   109 / 71  (right arm)  Vitals Entered By: Wendall Mola CMA Duncan Dull) (December 16, 2009 9:23 AM) CC: follow-up visit, lab results Is Patient Diabetic? No Pain Assessment Patient in pain? no      Nutritional Status BMI of 25 - 29 = overweight Nutritional Status Detail appetite "good"  Does patient need assistance? Functional Status Self care Ambulation Normal   Physical Exam  General:  alert, well-developed, well-nourished, and well-hydrated.   Head:  normocephalic and atraumatic.   Mouth:  pharynx pink and moist.   Lungs:  normal breath sounds.      Impression & Recommendations:  Problem # 1:  HIV DISEASE (ICD-042) Pt currently not on medication due to his desire not to be.  He will f/u in 3 months for repeat labs. 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 One by mouth tid    Lexapro 10 Mg Tabs (Escitalopram oxalate) ..... One tablet one time a day  Medications Added to Medication List This Visit: 1)  Lexapro 10 Mg Tabs (Escitalopram oxalate) .... One tablet one time a day  Other Orders: Est. Patient Level III (60454) Future Orders: T-CD4SP ([facility]) (CD4SP) ... 03/16/2010 T-HIV Viral Load [phone]) ... 03/16/2010 T-Comprehensive Metabolic Panel [phone]) ... 03/16/2010 T-CBC  w/Diff ([id number]) ... 03/16/2010 T-Lipid Profile [phone]) ... 03/16/2010  Patient Instructions: 1)  Please schedule a follow-up appointment in 3 months, 2 weeks after labs.

## 2010-06-30 NOTE — Assessment & Plan Note (Signed)
Summary: URI/dm   Vital Signs:  Patient profile:   55 year old male Height:      75 inches Weight:      210 pounds BMI:     26.34 Temp:     99.0 degrees F oral Pulse rate:   72 / minute Resp:     14 per minute BP sitting:   116 / 80  (left arm)  Vitals Entered By: Willy Eddy, LPN (February 11, 2010 12:01 PM) CC: saw dr Caryl Never last week and was dx with thrush and completed mycelex trouch,but now c/o chills, vs sweats and dough and generalized feeling bad Is Patient Diabetic? No   Primary Care Kristofer Schaffert:  [name] MD  CC:  saw dr Caryl Never last week and was dx with thrush and completed mycelex trouch, but now c/o chills, and vs sweats and dough and generalized feeling bad.  History of Present Illness: recurrent basal cell on temple referred to derm for excision persistant night sweats and chills with recurrent thrush and symptomatic bronchitis with "green " sputum these symptoms have persisted  for  greater than two weeks tcell count is below 400 viral load stable monitering of infections discussed and choice of drugs influenced     Preventive Screening-Counseling & Management  Alcohol-Tobacco     Alcohol drinks/day: 0     Alcohol type: AT TIMES -WINE     Smoking Status: current     Smoking Cessation Counseling: yes     Packs/Day: 1.0     Year Started: 1973     Passive Smoke Exposure: yes  Problems Prior to Update: 1)  Dermatophytosis of Nail  (ICD-110.1) 2)  Allergic Urticaria  (ICD-708.0) 3)  Panic Disorder  (ICD-300.01) 4)  Acute Maxillary Sinusitis  (ICD-461.0) 5)  Basal Cell Carcinoma, Nose  (ICD-173.3) 6)  Skin Lesion  (ICD-709.9) 7)  Testosterone Deficiency  (ICD-257.2) 8)  Physical Examination  (ICD-V70.0) 9)  Erectile Dysfunction, Organic  (ICD-607.84) 10)  Excessive Belching  (ICD-787.3) 11)  Abscess, Tooth  (ICD-522.5) 12)  Bronchitis, Chronic  (ICD-491.9) 13)  Bronchial Pneumonia  (ICD-485) 14)  Cough  (ICD-786.2) 15)  Dysphagia,  Unspecified  (ICD-787.20) 16)  Smoker  (ICD-305.1) 17)  Onychomycosis, Toenails  (ICD-110.1) 18)  Spontaneous Pneumothorax  (ICD-512.8) 19)  Anxiety  (ICD-300.00) 20)  HIV Disease  (ICD-042)  Current Problems (verified): 1)  Dermatophytosis of Nail  (ICD-110.1) 2)  Allergic Urticaria  (ICD-708.0) 3)  Panic Disorder  (ICD-300.01) 4)  Acute Maxillary Sinusitis  (ICD-461.0) 5)  Basal Cell Carcinoma, Nose  (ICD-173.3) 6)  Skin Lesion  (ICD-709.9) 7)  Testosterone Deficiency  (ICD-257.2) 8)  Physical Examination  (ICD-V70.0) 9)  Erectile Dysfunction, Organic  (ICD-607.84) 10)  Excessive Belching  (ICD-787.3) 11)  Abscess, Tooth  (ICD-522.5) 12)  Bronchitis, Chronic  (ICD-491.9) 13)  Bronchial Pneumonia  (ICD-485) 14)  Cough  (ICD-786.2) 15)  Dysphagia, Unspecified  (ICD-787.20) 16)  Smoker  (ICD-305.1) 17)  Onychomycosis, Toenails  (ICD-110.1) 18)  Spontaneous Pneumothorax  (ICD-512.8) 19)  Anxiety  (ICD-300.00) 20)  HIV Disease  (ICD-042)  Medications Prior to Update: 1)  Alprazolam 0.5 Mg Tabs (Alprazolam) .... One By Mouth Tid 2)  Lexapro 10 Mg Tabs (Escitalopram Oxalate) .... One Tablet One Time A Day  Current Medications (verified): 1)  Alprazolam 0.5 Mg Tabs (Alprazolam) .... One By Mouth Tid 2)  Lexapro 10 Mg Tabs (Escitalopram Oxalate) .... One Tablet One Time A Day 3)  Sulfamethoxazole-Tmp Ds 800-160 Mg Tab (Trimethoprim-Sulfamethoxazole) .Marland KitchenMarland KitchenMarland Kitchen  Take 1 Tablet By Mouth Morning and Night 4)  Bromfed Dm 30-2-10 Mg/68ml Syrp (Pseudoeph-Bromphen-Dm) .... Two Tsp By Mouth Three Times A Day As Needed 5)  Fluconazole 100 Mg Tabs (Fluconazole) .... One By Mouth Daily For 14 Day  Allergies (verified): No Known Drug Allergies  Past History:  Family History: Last updated: 07/17/2008 No early CAD Anurysm hx in father's side father with ephasema  Social History: Last updated: 07/17/2008 Domestic Partner Current Smoker-1 ppd Alcohol use-yes Occupation:[employer]  Risk  Factors: Alcohol Use: 0 (02/11/2010) Caffeine Use: 0 (12/16/2009) Exercise: yes (12/16/2009)  Risk Factors: Smoking Status: current (02/11/2010) Packs/Day: 1.0 (02/11/2010) Passive Smoke Exposure: yes (02/11/2010)  Past medical, surgical, family and social histories (including risk factors) reviewed, and no changes noted (except as noted below).  Past Medical History: Reviewed history from 07/17/2008 and no changes required. HIV disease, diagnosed in1997   ( primary did diagnosis, 1st care in  [facility]  with AZT then dual therapy with retrovir and another  stopped in 2001 and has had stable levels Anxiety Spontaneous pneumothorax Onychomycosis, tonails Smoker chronic  bronchitis possible ephasematous liung changes  Past Surgical History: Reviewed history from 07/17/2008 and no changes required. chest tube for colasped 1992  Family History: Reviewed history from 07/17/2008 and no changes required. No early CAD Anurysm hx in father's side father with ephasema  Social History: Reviewed history from 07/17/2008 and no changes required. Domestic Partner Current Smoker-1 ppd Alcohol use-yes Occupation:[employer]  Review of Systems  The patient denies anorexia, fever, weight loss, weight gain, vision loss, decreased hearing, hoarseness, chest pain, syncope, dyspnea on exertion, peripheral edema, prolonged cough, headaches, hemoptysis, abdominal pain, melena, hematochezia, severe indigestion/heartburn, hematuria, incontinence, genital sores, muscle weakness, suspicious skin lesions, transient blindness, difficulty walking, depression, unusual weight change, abnormal bleeding, enlarged lymph nodes, angioedema, and breast masses.    Physical Exam  General:  Well-developed,well-nourished,in no acute distress; alert,appropriate and cooperative throughout examination Head:  normocephalic and atraumatic.   Eyes:  pupils equal and pupils round.   Ears:  R ear normal and L ear  normal.   Nose:  external erythema and nasal dischargemucosal pallor.   Mouth:  posterior lymphoid hypertrophy and postnasal drip.   Neck:  No deformities, masses, or tenderness noted. Lungs:  Normal respiratory effort, chest expands symmetrically. Lungs are clear to auscultation, no crackles or wheezes. Heart:  normal rate and regular rhythm.   Abdomen:  soft and no distention.   Msk:  No deformity or scoliosis noted of thoracic or lumbar spine.   Pulses:  R and L carotid,radial,femoral,dorsalis pedis and posterior tibial pulses are full and equal bilaterally Extremities:  No clubbing, cyanosis, edema, or deformity noted with normal full range of motion of all joints.   Neurologic:  No cranial nerve deficits noted. Station and gait are normal. Plantar reflexes are down-going bilaterally. DTRs are symmetrical throughout. Sensory, motor and coordinative functions appear intact.   Impression & Recommendations:  Problem # 1:  NIGHT SWEATS (ICD-780.8) Assessment New trend for t cell noted slight drop below 400 if continues decline in light of recurrent thrush and febril ilnes cough and conjested night sweats  Problem # 2:  ACUTE BRONCHITIS (ICD-466.0) Assessment: New  Take antibiotics and other medications as directed. Encouraged to push clear liquids, get enough rest, and take acetaminophen as needed. To be seen in 5-7 days if no improvement, sooner if worse.  His updated medication list for this problem includes:    Sulfamethoxazole-tmp Ds 800-160 Mg  Tab (Trimethoprim-sulfamethoxazole) .Marland Kitchen... Take 1 tablet by mouth morning and night    Bromfed Dm 30-2-10 Mg/43ml Syrp (Pseudoeph-bromphen-dm) .Marland Kitchen..Marland Kitchen Two tsp by mouth three times a day as needed  Complete Medication List: 1)  Alprazolam 0.5 Mg Tabs (Alprazolam) .... One by mouth tid 2)  Lexapro 10 Mg Tabs (Escitalopram oxalate) .... One tablet one time a day 3)  Sulfamethoxazole-tmp Ds 800-160 Mg Tab (Trimethoprim-sulfamethoxazole) .... Take  1 tablet by mouth morning and night 4)  Bromfed Dm 30-2-10 Mg/54ml Syrp (Pseudoeph-bromphen-dm) .... Two tsp by mouth three times a day as needed 5)  Fluconazole 100 Mg Tabs (Fluconazole) .... One by mouth daily for 14 day  Patient Instructions: 1)  call back in 10 days leave a message for bonnye about night sweats and how you are feeling Prescriptions: FLUCONAZOLE 100 MG TABS (FLUCONAZOLE) one by mouth daily for 14 day  #14 x 0   Entered and Authorized by:   [name] MD   Signed by:   [name] MD on 02/11/2010   Method used:   Electronically to        [facility] [phone]* (retail)       [address] [place], [place]  [address]       Ph: [phone] or [phone]       Fax: [phone]   RxID:   [id number] BROMFED DM 30-2-10 MG/5ML SYRP (PSEUDOEPH-BROMPHEN-DM) two tsp by mouth three times a day as needed  #6oz x 0   Entered and Authorized by:   [name] MD   Signed by:   [name] MD on 02/11/2010   Method used:   Electronically to        [facility] [phone]* (retail)       [address] [place], [place]  [address]       Ph: [phone] or [phone]       Fax: [phone]   RxID:   [id number] SULFAMETHOXAZOLE-TMP DS 800-160 MG TAB (TRIMETHOPRIM-SULFAMETHOXAZOLE) Take 1 tablet by mouth morning and night  #28 x 0   Entered and Authorized by:   [name] MD   Signed by:   [name] MD on 02/11/2010   Method used:   Electronically to        [facility] [phone]* (retail)       [address] [place], [place]  [address]       Ph: [phone] or [phone]       Fax: [phone]   RxID:   [id number]

## 2010-07-14 ENCOUNTER — Encounter (INDEPENDENT_AMBULATORY_CARE_PROVIDER_SITE_OTHER): Payer: Self-pay | Admitting: *Deleted

## 2010-07-16 ENCOUNTER — Encounter (INDEPENDENT_AMBULATORY_CARE_PROVIDER_SITE_OTHER): Payer: Self-pay | Admitting: *Deleted

## 2010-07-20 NOTE — Miscellaneous (Signed)
  Clinical Lists Changes 

## 2010-07-20 NOTE — Miscellaneous (Signed)
  Clinical Lists Changes  Observations: Added new observation of INCOMESOURCE: [employer] (07/14/2010 16:25)

## 2010-08-10 LAB — T-HELPER CELL (CD4) - (RCID CLINIC ONLY): CD4 T Cell Abs: 490 uL (ref 400–2700)

## 2010-08-17 LAB — T-HELPER CELL (CD4) - (RCID CLINIC ONLY)
CD4 % Helper T Cell: 18 % — ABNORMAL LOW (ref 33–55)
CD4 T Cell Abs: 470 uL (ref 400–2700)

## 2010-08-31 LAB — T-HELPER CELL (CD4) - (RCID CLINIC ONLY)
CD4 % Helper T Cell: 15 % — ABNORMAL LOW (ref 33–55)
CD4 T Cell Abs: 400 uL (ref 400–2700)

## 2010-09-04 LAB — T-HELPER CELL (CD4) - (RCID CLINIC ONLY): CD4 T Cell Abs: 590 uL (ref 400–2700)

## 2010-09-06 ENCOUNTER — Other Ambulatory Visit (INDEPENDENT_AMBULATORY_CARE_PROVIDER_SITE_OTHER): Payer: 59

## 2010-09-06 DIAGNOSIS — B20 Human immunodeficiency virus [HIV] disease: Secondary | ICD-10-CM

## 2010-09-06 LAB — CBC WITH DIFFERENTIAL/PLATELET
Hemoglobin: 15.4 g/dL (ref 13.0–17.0)
Lymphs Abs: 2.3 10*3/uL (ref 0.7–4.0)
Monocytes Relative: 7 % (ref 3–12)
Neutro Abs: 3 10*3/uL (ref 1.7–7.7)
Neutrophils Relative %: 49 % (ref 43–77)
RBC: 4.87 MIL/uL (ref 4.22–5.81)
WBC: 6 10*3/uL (ref 4.0–10.5)

## 2010-09-06 LAB — COMPLETE METABOLIC PANEL WITH GFR
CO2: 27 mEq/L (ref 19–32)
Creat: 0.84 mg/dL (ref 0.40–1.50)
GFR, Est African American: >60 mL/min (ref >60)
GFR, Est Non African American: >60 mL/min (ref >60)
Glucose, Bld: 85 mg/dL (ref 70–99)
Total Bilirubin: 0.4 mg/dL (ref 0.3–1.2)

## 2010-09-07 LAB — T-HELPER CELL (CD4) - (RCID CLINIC ONLY)
CD4 % Helper T Cell: 17 % — ABNORMAL LOW (ref 33–55)
CD4 % Helper T Cell: 15 % — ABNORMAL LOW (ref 33–55)
CD4 T Cell Abs: 400 uL (ref 400–2700)
CD4 T Cell Abs: 380 uL — ABNORMAL LOW (ref 400–2700)

## 2010-09-07 LAB — HIV-1 RNA QUANT-NO REFLEX-BLD: HIV-1 RNA Quant, Log: 4.87 {Log} — ABNORMAL HIGH (ref <1.30)

## 2010-09-08 ENCOUNTER — Encounter: Payer: Self-pay | Admitting: Internal Medicine

## 2010-09-08 ENCOUNTER — Ambulatory Visit (INDEPENDENT_AMBULATORY_CARE_PROVIDER_SITE_OTHER): Payer: 59 | Admitting: Internal Medicine

## 2010-09-08 DIAGNOSIS — J209 Acute bronchitis, unspecified: Secondary | ICD-10-CM

## 2010-09-08 DIAGNOSIS — R61 Generalized hyperhidrosis: Secondary | ICD-10-CM

## 2010-09-08 MED ORDER — AZITHROMYCIN 250 MG PO TABS: ORAL_TABLET | ORAL | Status: AC

## 2010-09-08 NOTE — Progress Notes (Signed)
  Subjective:    Patient ID: James Terrell, male    DOB: 20-May-1956, 55 y.o.   MRN: [id number]  HPI  55 year old patient who has a history of HIV disease since 55. He is being followed by ID and apparently has fluctuating CD4 counts and viral loads. He has not been on any antivirals for some time. For the past 3 days he has had worsening productive cough. He does continue to smoke. The past 2 days has had increasing fever body aches and worsening fatigue. Cough has become quite productive and purulent. He does have a history of chronic bronchitis and prior pneumonia. Denies any chills chest pain or shortness of breath    Review of Systems  Constitutional: Positive for fatigue. Negative for fever, chills and appetite change.  HENT: Negative for hearing loss, ear pain, congestion, sore throat, trouble swallowing, neck stiffness, dental problem, voice change and tinnitus.   Eyes: Negative for pain, discharge and visual disturbance.  Respiratory: Positive for cough. Negative for chest tightness, wheezing and stridor.   Cardiovascular: Negative for chest pain, palpitations and leg swelling.  Gastrointestinal: Negative for nausea, vomiting, abdominal pain, diarrhea, constipation, blood in stool and abdominal distention.  Genitourinary: Negative for urgency, hematuria, flank pain, discharge, difficulty urinating and genital sores.  Musculoskeletal: Negative for myalgias, back pain, joint swelling, arthralgias and gait problem.  Skin: Negative for rash.  Neurological: Negative for dizziness, syncope, speech difficulty, weakness, numbness and headaches.  Hematological: Negative for adenopathy. Does not bruise/bleed easily.  Psychiatric/Behavioral: Negative for behavioral problems and dysphoric mood. The patient is not nervous/anxious.        Objective:   Physical Exam  Constitutional: He is oriented to person, place, and time. He appears well-developed.       Oropharynx is minimally injected but  no signs of thrush  HENT:  Head: Normocephalic.  Right Ear: External ear normal.  Left Ear: External ear normal.  Mouth/Throat: Oropharynx is clear and moist. No oropharyngeal exudate.  Eyes: Conjunctivae and EOM are normal.  Neck: Normal range of motion.  Cardiovascular: Normal rate and normal heart sounds.   Pulmonary/Chest: Breath sounds normal.  Abdominal: Bowel sounds are normal.  Musculoskeletal: Normal range of motion. He exhibits no edema and no tenderness.  Neurological: He is alert and oriented to person, place, and time.  Psychiatric: He has a normal mood and affect. His behavior is normal.          Assessment & Plan:   Acute bronchitis Ongoing tobacco use HIV disease  Cessation of all tobacco products encouraged. Will treat with a Z-Pak and clinically observed. If he worsens will obtain a chest x-ray and reevaluate

## 2010-09-08 NOTE — Patient Instructions (Signed)
Smoking tobacco is very bad for your health. You should stop smoking immediately.  Get plenty of rest, Drink lots of  clear liquids, and use Tylenol or ibuprofen for fever and discomfort.    Take your antibiotic as prescribed until ALL of it is gone, but stop if you develop a rash, swelling, or any side effects of the medication.  Contact our office as soon as possible if  there are side effects of the medication.  Call or return to clinic prn if these symptoms worsen or fail to improve as anticipated.

## 2010-09-21 ENCOUNTER — Ambulatory Visit (INDEPENDENT_AMBULATORY_CARE_PROVIDER_SITE_OTHER): Payer: 59 | Admitting: Infectious Diseases

## 2010-09-21 ENCOUNTER — Encounter: Payer: Self-pay | Admitting: Infectious Diseases

## 2010-09-21 DIAGNOSIS — F411 Generalized anxiety disorder: Secondary | ICD-10-CM

## 2010-09-21 DIAGNOSIS — B370 Candidal stomatitis: Secondary | ICD-10-CM

## 2010-09-21 DIAGNOSIS — F17200 Nicotine dependence, unspecified, uncomplicated: Secondary | ICD-10-CM

## 2010-09-21 DIAGNOSIS — C44300 Unspecified malignant neoplasm of skin of unspecified part of face: Secondary | ICD-10-CM

## 2010-09-21 DIAGNOSIS — B20 Human immunodeficiency virus [HIV] disease: Secondary | ICD-10-CM

## 2010-09-21 DIAGNOSIS — Z113 Encounter for screening for infections with a predominantly sexual mode of transmission: Secondary | ICD-10-CM

## 2010-09-21 DIAGNOSIS — Z79899 Other long term (current) drug therapy: Secondary | ICD-10-CM

## 2010-09-21 NOTE — Assessment & Plan Note (Signed)
He is doing well with his lexapro and his xanax. He does not drink in excess due to taking these rx's

## 2010-09-21 NOTE — Progress Notes (Signed)
  Subjective:    Patient ID: James Terrell, male    DOB: September 18, 1955, 55 y.o.   MRN: [id number]  HPI 55 yo M HIV+ diagnosed in1997 (1st care in  [facility] with AZT then dual therapy with AZT/? stopped in 2001). CD4 400 and VL 74,400 (09-06-10). His VL has been steadily increasing, CD4 has been stable. Was seen by MD recently for bronchitis. Wants to be seen by Derm as has had muultple skin cancers.   Review of Systems     Objective:   Physical Exam  Constitutional: He appears well-developed and well-nourished.  Eyes: EOM are normal. Pupils are equal, round, and reactive to light.  Neck: Neck supple.  Cardiovascular: Normal rate, regular rhythm and normal heart sounds.   Pulmonary/Chest: Effort normal and breath sounds normal. No respiratory distress. He has no wheezes.  Abdominal: Soft. Bowel sounds are normal. He exhibits no distension.          Assessment & Plan:

## 2010-09-21 NOTE — Assessment & Plan Note (Signed)
Very minimal today. Will treat prn. He has more GERD sx today.

## 2010-09-21 NOTE — Assessment & Plan Note (Signed)
Not ready to quit, "I love smoking cigarettes"

## 2010-09-21 NOTE — Assessment & Plan Note (Signed)
Will see him back in 3-4 months with labs prior. He is doing ok, his slowly falling CD4 and raising VL are of some concern. He is given condoms.  He needs Hep A vaccine.

## 2010-09-21 NOTE — Assessment & Plan Note (Signed)
He has a hx of multiple skin CAs. He will call his dermatologist and make an appt for re-eval.

## 2010-11-29 ENCOUNTER — Ambulatory Visit (INDEPENDENT_AMBULATORY_CARE_PROVIDER_SITE_OTHER): Payer: 59 | Admitting: Internal Medicine

## 2010-11-29 ENCOUNTER — Encounter: Payer: Self-pay | Admitting: Internal Medicine

## 2010-11-29 DIAGNOSIS — B370 Candidal stomatitis: Secondary | ICD-10-CM

## 2010-11-29 MED ORDER — NYSTATIN 100000 UNIT/ML MT SUSP: OROMUCOSAL | Status: AC

## 2010-11-29 MED ORDER — FLUCONAZOLE 100 MG PO TABS: 100.0000 mg | ORAL_TABLET | Freq: Every day | ORAL | Status: AC

## 2010-11-29 NOTE — Progress Notes (Signed)
  Subjective:    Patient ID: James Terrell, male    DOB: 05-23-1956, 55 y.o.   MRN: [id number]  HPI Pt presents to clinic for evaluation of possible thrush. Notes ~1wk h/o oral thrush with white coating on tongue and tongue discomfort/irritation. Denies dysphagia but has mild associated ST. H/o previous thrush last treated 4/12 successfully with oral diflucan. H/o HIV followed by ID with last CD4 ~400. No alleviating or exacerbating factors. Taking no medications for the problem. No other complaints.   Reviewed pmh, medications and allergies    Review of Systems  Constitutional: Negative for fever and chills.  HENT: Positive for sore throat. Negative for facial swelling, mouth sores and trouble swallowing.        Objective:   Physical Exam  Nursing note and vitals reviewed. Constitutional: He appears well-developed and well-nourished. No distress.  HENT:  Head: Normocephalic and atraumatic.  Right Ear: External ear normal.  Left Ear: External ear normal.  Nose: Nose normal.  Mouth/Throat: Uvula is midline and mucous membranes are normal. No uvula swelling.       White plaquing diffuse tongue  Eyes: Conjunctivae are normal. No scleral icterus.  Neurological: He is alert.  Skin: Skin is warm and dry. He is not diaphoretic.  Psychiatric: He has a normal mood and affect.          Assessment & Plan:

## 2010-11-29 NOTE — Assessment & Plan Note (Signed)
Attempt 14 day course of oral diflucan. Add nystatin swish and swallow. Pt to also discuss with ID at followup.

## 2010-12-27 ENCOUNTER — Other Ambulatory Visit: Payer: Self-pay | Admitting: Infectious Diseases

## 2010-12-27 ENCOUNTER — Other Ambulatory Visit: Payer: 59

## 2010-12-27 DIAGNOSIS — B20 Human immunodeficiency virus [HIV] disease: Secondary | ICD-10-CM

## 2010-12-27 DIAGNOSIS — Z113 Encounter for screening for infections with a predominantly sexual mode of transmission: Secondary | ICD-10-CM

## 2010-12-27 DIAGNOSIS — Z79899 Other long term (current) drug therapy: Secondary | ICD-10-CM

## 2010-12-27 LAB — COMPREHENSIVE METABOLIC PANEL
Albumin: 4 g/dL (ref 3.5–5.2)
BUN: 12 mg/dL (ref 6–23)
Calcium: 9.4 mg/dL (ref 8.4–10.5)
Chloride: 106 mEq/L (ref 96–112)
Glucose, Bld: 86 mg/dL (ref 70–99)
Potassium: 4.1 mEq/L (ref 3.5–5.3)

## 2010-12-27 LAB — LIPID PANEL
HDL: 22 mg/dL — ABNORMAL LOW (ref >39)
LDL Cholesterol: 40 mg/dL (ref 0–99)
Total CHOL/HDL Ratio: 5.5 Ratio
VLDL: 58 mg/dL — ABNORMAL HIGH (ref 0–40)

## 2010-12-27 LAB — CBC
Platelets: 133 10*3/uL — ABNORMAL LOW (ref 150–400)
RDW: 14.5 % (ref 11.5–15.5)
WBC: 5.8 10*3/uL (ref 4.0–10.5)

## 2010-12-28 LAB — HIV-1 RNA ULTRAQUANT REFLEX TO GENTYP+
HIV 1 RNA Quant: 74500 copies/mL — ABNORMAL HIGH (ref <20)
HIV-1 RNA Quant, Log: 4.87 {Log} — ABNORMAL HIGH (ref <1.30)

## 2011-01-04 LAB — HIV-1 GENOTYPR PLUS

## 2011-01-11 ENCOUNTER — Encounter: Payer: Self-pay | Admitting: Infectious Diseases

## 2011-01-11 ENCOUNTER — Ambulatory Visit (INDEPENDENT_AMBULATORY_CARE_PROVIDER_SITE_OTHER): Payer: 59 | Admitting: Infectious Diseases

## 2011-01-11 DIAGNOSIS — Z79899 Other long term (current) drug therapy: Secondary | ICD-10-CM

## 2011-01-11 DIAGNOSIS — Z23 Encounter for immunization: Secondary | ICD-10-CM

## 2011-01-11 DIAGNOSIS — B20 Human immunodeficiency virus [HIV] disease: Secondary | ICD-10-CM

## 2011-01-11 DIAGNOSIS — E291 Testicular hypofunction: Secondary | ICD-10-CM

## 2011-01-11 DIAGNOSIS — B370 Candidal stomatitis: Secondary | ICD-10-CM

## 2011-01-11 DIAGNOSIS — F17200 Nicotine dependence, unspecified, uncomplicated: Secondary | ICD-10-CM

## 2011-01-11 DIAGNOSIS — Z113 Encounter for screening for infections with a predominantly sexual mode of transmission: Secondary | ICD-10-CM

## 2011-01-11 LAB — CORTISOL: Cortisol, Plasma: 6.3 ug/dL

## 2011-01-11 LAB — TSH: TSH: 1.367 u[IU]/mL (ref 0.350–4.500)

## 2011-01-11 MED ORDER — TESTOSTERONE 2 MG/24HR TD PT24: 1.0000 | MEDICATED_PATCH | Freq: Every day | TRANSDERMAL | Status: DC

## 2011-01-11 MED ORDER — FLUCONAZOLE 100 MG PO TABS: 100.0000 mg | ORAL_TABLET | Freq: Every day | ORAL | Status: AC

## 2011-01-11 NOTE — Assessment & Plan Note (Signed)
Has difficulty with maintaining erection. Would suggest this and his sweats are related to his testosterone deficiency.

## 2011-01-11 NOTE — Progress Notes (Signed)
Addended by: Wendall Mola A on: 01/11/2011 09:54 AM   Modules accepted: Orders

## 2011-01-11 NOTE — Progress Notes (Signed)
  Subjective:    Patient ID: James Terrell, male    DOB: 1955/11/01, 55 y.o.   MRN: [id number]  HPI 55 yo M HIV+ diagnosed in1997 (1st care in [facility] with AZT then dual therapy with AZT/? stopped in 2001). CD4 490 and VL 74,500. Trig 291 (12-27-10). 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 Still having problems with thrush. Has been treated 3x this year. C/o sweating profusely with going outside- mowing yard. This is changed from previous.     Review of Systems  Constitutional: Negative for fever, chills and unexpected weight change.  Respiratory: Negative for cough and shortness of breath.   Gastrointestinal: Negative for diarrhea and constipation.  Genitourinary: Negative for dysuria.       Objective:   Physical Exam  Constitutional: He appears well-developed and well-nourished.  Eyes: EOM are normal. Pupils are equal, round, and reactive to light.  Neck: Neck supple.  Cardiovascular: Normal rate, regular rhythm and normal heart sounds.   Pulmonary/Chest: Effort normal and breath sounds normal. No respiratory distress. He has no wheezes.  Abdominal: Soft. Bowel sounds are normal. He exhibits no distension. There is no tenderness.  Lymphadenopathy:    He has no cervical adenopathy.          Assessment & Plan:

## 2011-01-11 NOTE — Assessment & Plan Note (Signed)
Will put him on daily flucon 100mg  qday.

## 2011-01-11 NOTE — Assessment & Plan Note (Signed)
Encouraged to quit. 

## 2011-01-11 NOTE — Assessment & Plan Note (Signed)
Offered to start him on ART but he wishes to defer. He is offered Fluvax bu he defers this as well (wants to wait until November). Offered condoms, defers. Will give him Hep A vax.

## 2011-02-14 ENCOUNTER — Encounter: Payer: Self-pay | Admitting: Internal Medicine

## 2011-02-14 ENCOUNTER — Ambulatory Visit (INDEPENDENT_AMBULATORY_CARE_PROVIDER_SITE_OTHER): Payer: 59 | Admitting: Internal Medicine

## 2011-02-14 DIAGNOSIS — R071 Chest pain on breathing: Secondary | ICD-10-CM

## 2011-02-14 DIAGNOSIS — E291 Testicular hypofunction: Secondary | ICD-10-CM

## 2011-02-14 DIAGNOSIS — Z13820 Encounter for screening for osteoporosis: Secondary | ICD-10-CM

## 2011-02-14 DIAGNOSIS — F17200 Nicotine dependence, unspecified, uncomplicated: Secondary | ICD-10-CM

## 2011-02-14 DIAGNOSIS — R0789 Other chest pain: Secondary | ICD-10-CM

## 2011-02-14 NOTE — Patient Instructions (Signed)
Call or return to clinic prn if these symptoms worsen or fail to improve as anticipated.  Bone density study as discussed

## 2011-02-14 NOTE — Progress Notes (Signed)
  Subjective:    Patient ID: James Terrell, male    DOB: [birth year], 55 y.o.   MRN: [id number]  Muscle Pain Associated symptoms include chest pain. Pertinent negatives include no abdominal pain, constipation, diarrhea, eye pain, fatigue, fever, headaches, joint swelling, nausea, rash, vomiting, weakness or wheezing.    55 year old patient who has a history of HIV disease testosterone deficiency and ongoing tobacco use. He was stable until 6 days ago when he leaned over in a chair to his left and had acute pain in his left anterolateral chest wall region. Pain has continued over the past 6 days. Pain is significant but tolerable and he states that he is here for reassurance purposes only;  due to the location of pain he was somewhat concerned about a cardiac etiology. He denies any shortness of breath pain is aggravated by movement and palpation over the left anterolateral chest wall area.  He states that he does have a history of spontaneous pneumothorax on the left    Review of Systems  Constitutional: Negative for fever, chills, appetite change and fatigue.  HENT: Negative for hearing loss, ear pain, congestion, sore throat, trouble swallowing, neck stiffness, dental problem, voice change and tinnitus.   Eyes: Negative for pain, discharge and visual disturbance.  Respiratory: Negative for cough, chest tightness, wheezing and stridor.   Cardiovascular: Positive for chest pain. Negative for palpitations and leg swelling.  Gastrointestinal: Negative for nausea, vomiting, abdominal pain, diarrhea, constipation, blood in stool and abdominal distention.  Genitourinary: Negative for urgency, hematuria, flank pain, discharge, difficulty urinating and genital sores.  Musculoskeletal: Negative for myalgias, back pain, joint swelling, arthralgias and gait problem.  Skin: Negative for rash.  Neurological: Negative for dizziness, syncope, speech difficulty, weakness, numbness and headaches.    Hematological: Negative for adenopathy. Does not bruise/bleed easily.  Psychiatric/Behavioral: Negative for behavioral problems and dysphoric mood. The patient is not nervous/anxious.        Objective:   Physical Exam  Constitutional: He appears well-developed and well-nourished. No distress.       No acute distress. Afebrile. Normal blood pressure  Cardiovascular: Normal rate, regular rhythm and normal heart sounds.  Exam reveals no friction rub.   No murmur heard. Pulmonary/Chest: Effort normal and breath sounds normal. No respiratory distress. He has no wheezes. He has no rales. He exhibits tenderness.       Point tenderness was noted over the left anterolateral chest wall probably the fifth rib. Breast sounds are equal and symmetrical          Assessment & Plan:    Left chest wall pain. Probable rib contusion or possible fracture. Patient is at high risk for osteoporosis with HIV disease testosterone deficiency and ongoing tobacco use. We'll check a bone density at his convenience.

## 2011-02-15 LAB — T-HELPER CELL (CD4) - (RCID CLINIC ONLY)
CD4 % Helper T Cell: 22 — ABNORMAL LOW
CD4 T Cell Abs: 500

## 2011-02-21 ENCOUNTER — Ambulatory Visit (INDEPENDENT_AMBULATORY_CARE_PROVIDER_SITE_OTHER)
Admission: RE | Admit: 2011-02-21 | Discharge: 2011-02-21 | Disposition: A | Discharge status: Home / Self Care | Payer: 59 | Source: Ambulatory Visit

## 2011-02-21 DIAGNOSIS — Z13820 Encounter for screening for osteoporosis: Secondary | ICD-10-CM

## 2011-02-22 ENCOUNTER — Other Ambulatory Visit: Payer: 59

## 2011-02-27 ENCOUNTER — Encounter: Payer: Self-pay | Admitting: Internal Medicine

## 2011-03-01 LAB — T-HELPER CELL (CD4) - (RCID CLINIC ONLY): CD4 T Cell Abs: 540

## 2011-04-05 ENCOUNTER — Ambulatory Visit (INDEPENDENT_AMBULATORY_CARE_PROVIDER_SITE_OTHER): Payer: 59 | Admitting: *Deleted

## 2011-04-05 ENCOUNTER — Other Ambulatory Visit (INDEPENDENT_AMBULATORY_CARE_PROVIDER_SITE_OTHER): Payer: 59

## 2011-04-05 DIAGNOSIS — Z79899 Other long term (current) drug therapy: Secondary | ICD-10-CM

## 2011-04-05 DIAGNOSIS — Z23 Encounter for immunization: Secondary | ICD-10-CM

## 2011-04-05 DIAGNOSIS — B20 Human immunodeficiency virus [HIV] disease: Secondary | ICD-10-CM

## 2011-04-05 DIAGNOSIS — Z113 Encounter for screening for infections with a predominantly sexual mode of transmission: Secondary | ICD-10-CM

## 2011-04-05 LAB — CBC
Hemoglobin: 16 g/dL (ref 13.0–17.0)
MCH: 31.8 pg (ref 26.0–34.0)
MCV: 92.8 fL (ref 78.0–100.0)
RBC: 5.03 MIL/uL (ref 4.22–5.81)

## 2011-04-05 LAB — LIPID PANEL
Cholesterol: 131 mg/dL (ref 0–200)
Triglycerides: 219 mg/dL — ABNORMAL HIGH (ref <150)

## 2011-04-05 LAB — COMPREHENSIVE METABOLIC PANEL
Albumin: 4.3 g/dL (ref 3.5–5.2)
CO2: 27 mEq/L (ref 19–32)
Calcium: 9.1 mg/dL (ref 8.4–10.5)
Glucose, Bld: 88 mg/dL (ref 70–99)
Potassium: 4.2 mEq/L (ref 3.5–5.3)
Sodium: 137 mEq/L (ref 135–145)
Total Protein: 7.9 g/dL (ref 6.0–8.3)

## 2011-04-06 LAB — T-HELPER CELL (CD4) - (RCID CLINIC ONLY): CD4 % Helper T Cell: 15 % — ABNORMAL LOW (ref 33–55)

## 2011-04-07 DIAGNOSIS — B20 Human immunodeficiency virus [HIV] disease: Secondary | ICD-10-CM

## 2011-04-19 ENCOUNTER — Ambulatory Visit (INDEPENDENT_AMBULATORY_CARE_PROVIDER_SITE_OTHER): Payer: 59 | Admitting: Infectious Diseases

## 2011-04-19 ENCOUNTER — Encounter: Payer: Self-pay | Admitting: Infectious Diseases

## 2011-04-19 DIAGNOSIS — B20 Human immunodeficiency virus [HIV] disease: Secondary | ICD-10-CM

## 2011-04-19 DIAGNOSIS — Z113 Encounter for screening for infections with a predominantly sexual mode of transmission: Secondary | ICD-10-CM

## 2011-04-19 DIAGNOSIS — Z79899 Other long term (current) drug therapy: Secondary | ICD-10-CM

## 2011-04-19 NOTE — Assessment & Plan Note (Signed)
He is doing well. We spoke at length re: his need for ART. He meets current tx guidelines, i gave him the option to start ART but he feels he is doing well. He will come back in 3-4 months with labs prior and he/we will re-evaluate.  I am unclear as to the cause of his allopecia. We discussed that most commonly this is due to stress, other causes would be lupus, syphillis. He does not appear to be at risk for either of the later. Will f/u at next visit.

## 2011-04-19 NOTE — Progress Notes (Signed)
  Subjective:    Patient ID: James Terrell, male    DOB: 04-Jul-1955, 55 y.o.   MRN: [id number]  HPI 55 yo M HIV+ diagnosed in1997 (1st care in [facility] with AZT then dual therapy with AZT/? stopped in 2001). CD4 450 and VL 136,500 (04-05-11). Trig 291 (12-27-10). 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  Here today to discuss his results. Has some concern that he is loosing his hair.    Review of Systems     Objective:   Physical Exam  Constitutional: He appears well-developed and well-nourished.       i do not see evidence of allopecia.   Eyes: EOM are normal. Pupils are equal, round, and reactive to light.  Neck: Neck supple.  Cardiovascular: Normal rate, regular rhythm and normal heart sounds.   Pulmonary/Chest: Effort normal and breath sounds normal.  Abdominal: Soft. Bowel sounds are normal. There is no tenderness. There is no rebound.  Lymphadenopathy:    He has no cervical adenopathy.          Assessment & Plan:

## 2011-08-21 ENCOUNTER — Other Ambulatory Visit: Payer: 59

## 2011-08-21 DIAGNOSIS — B20 Human immunodeficiency virus [HIV] disease: Secondary | ICD-10-CM

## 2011-08-21 DIAGNOSIS — Z79899 Other long term (current) drug therapy: Secondary | ICD-10-CM

## 2011-08-21 DIAGNOSIS — Z113 Encounter for screening for infections with a predominantly sexual mode of transmission: Secondary | ICD-10-CM

## 2011-08-21 LAB — RPR

## 2011-08-21 LAB — CBC
MCH: 31.2 pg (ref 26.0–34.0)
Platelets: 126 10*3/uL — ABNORMAL LOW (ref 150–400)
RBC: 5 MIL/uL (ref 4.22–5.81)
RDW: 14 % (ref 11.5–15.5)
WBC: 4.9 10*3/uL (ref 4.0–10.5)

## 2011-08-21 LAB — LIPID PANEL
HDL: 24 mg/dL — ABNORMAL LOW (ref >39)
LDL Cholesterol: 73 mg/dL (ref 0–99)
Total CHOL/HDL Ratio: 5.3 Ratio
VLDL: 31 mg/dL (ref 0–40)

## 2011-08-21 LAB — COMPLETE METABOLIC PANEL WITH GFR
ALT: 15 U/L (ref 0–53)
AST: 21 U/L (ref 0–37)
CO2: 30 mEq/L (ref 19–32)
Chloride: 106 mEq/L (ref 96–112)
GFR, Est African American: >89 mL/min
Sodium: 141 mEq/L (ref 135–145)
Total Bilirubin: 0.4 mg/dL (ref 0.3–1.2)
Total Protein: 7.3 g/dL (ref 6.0–8.3)

## 2011-08-23 LAB — HIV-1 RNA QUANT-NO REFLEX-BLD: HIV 1 RNA Quant: 110401 copies/mL — ABNORMAL HIGH (ref <20)

## 2011-09-04 ENCOUNTER — Ambulatory Visit (INDEPENDENT_AMBULATORY_CARE_PROVIDER_SITE_OTHER): Payer: 59 | Admitting: Infectious Diseases

## 2011-09-04 ENCOUNTER — Encounter: Payer: Self-pay | Admitting: Infectious Diseases

## 2011-09-04 VITALS — BP 118/77 | HR 76 | Temp 98.0°F | Ht 74.5 in | Wt 217.8 lb

## 2011-09-04 DIAGNOSIS — B20 Human immunodeficiency virus [HIV] disease: Secondary | ICD-10-CM

## 2011-09-04 DIAGNOSIS — Z0000 Encounter for general adult medical examination without abnormal findings: Secondary | ICD-10-CM

## 2011-09-04 DIAGNOSIS — F411 Generalized anxiety disorder: Secondary | ICD-10-CM

## 2011-09-04 DIAGNOSIS — Z23 Encounter for immunization: Secondary | ICD-10-CM

## 2011-09-04 MED ORDER — EMTRICITAB-RILPIVIR-TENOFOV DF 200-25-300 MG PO TABS
1.0000 | ORAL_TABLET | Freq: Every day | ORAL | Status: DC
Start: 2011-09-04 — End: 2011-09-21

## 2011-09-04 MED ORDER — ALPRAZOLAM 0.5 MG PO TABS: 0.5000 mg | ORAL_TABLET | Freq: Two times a day (BID) | ORAL | Status: DC | PRN

## 2011-09-04 MED ORDER — ESCITALOPRAM OXALATE 10 MG PO TABS: 10.0000 mg | ORAL_TABLET | Freq: Every day | ORAL | Status: DC

## 2011-09-04 NOTE — Assessment & Plan Note (Signed)
His CD4 has continued to slide down.will start him on complera (his VL is slightly high but recent evidence shows that this may work with VL < 250k). Emphasized need to take daily, with food.  Will see him back in 6 weeks to see if he is tolerating meds.

## 2011-09-04 NOTE — Assessment & Plan Note (Signed)
Will refill his rx's. He continues to have psy f/u.

## 2011-09-04 NOTE — Progress Notes (Signed)
  Subjective:    Patient ID: James Terrell, male    DOB: [birth year], 56 y.o.   MRN: [id number]  HPI 56 yo M HIV+ diagnosed in1997 (1st care in [facility] with AZT then dual therapy with AZT/? (stopped in 2001). 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   HIV 1 RNA Quant (copies/mL)  Date Value  08/21/2011 110401*  04/05/2011 136000*  12/27/2010 74500*     CD4 T Cell Abs (cmm)  Date Value  08/21/2011 370*  04/05/2011 450   12/27/2010 490        Review of Systems  Constitutional: Negative for appetite change and unexpected weight change.  Gastrointestinal: Negative for diarrhea and constipation.  Genitourinary: Negative for dysuria.       Objective:   Physical Exam  Constitutional: He appears well-developed and well-nourished.  HENT:  Mouth/Throat: No oropharyngeal exudate.  Eyes: EOM are normal. Pupils are equal, round, and reactive to light.  Neck: Neck supple.  Cardiovascular: Normal rate, regular rhythm and normal heart sounds.   Pulmonary/Chest: Effort normal and breath sounds normal.  Abdominal: Soft. Bowel sounds are normal. He exhibits no distension.  Lymphadenopathy:    He has no cervical adenopathy.          Assessment & Plan:

## 2011-09-19 ENCOUNTER — Other Ambulatory Visit: Payer: Self-pay | Admitting: Licensed Clinical Social Worker

## 2011-09-19 DIAGNOSIS — F411 Generalized anxiety disorder: Secondary | ICD-10-CM

## 2011-09-19 MED ORDER — ESCITALOPRAM OXALATE 10 MG PO TABS: 10.0000 mg | ORAL_TABLET | Freq: Every day | ORAL | Status: DC

## 2011-09-21 ENCOUNTER — Other Ambulatory Visit: Payer: Self-pay | Admitting: Licensed Clinical Social Worker

## 2011-09-21 DIAGNOSIS — F411 Generalized anxiety disorder: Secondary | ICD-10-CM

## 2011-09-21 DIAGNOSIS — B20 Human immunodeficiency virus [HIV] disease: Secondary | ICD-10-CM

## 2011-09-21 MED ORDER — ESCITALOPRAM OXALATE 10 MG PO TABS: 10.0000 mg | ORAL_TABLET | Freq: Every day | ORAL | Status: DC

## 2011-09-21 MED ORDER — EMTRICITAB-RILPIVIR-TENOFOV DF 200-25-300 MG PO TABS: 1.0000 | ORAL_TABLET | Freq: Every day | ORAL | Status: DC

## 2011-09-21 MED ORDER — EMTRICITAB-RILPIVIR-TENOFOV DF 200-25-300 MG PO TABS
1.0000 | ORAL_TABLET | Freq: Every day | ORAL | Status: DC
Start: 2011-09-21 — End: 2011-11-06

## 2011-09-21 MED ORDER — ALPRAZOLAM 0.5 MG PO TABS: 0.5000 mg | ORAL_TABLET | Freq: Two times a day (BID) | ORAL | Status: DC | PRN

## 2011-11-06 ENCOUNTER — Ambulatory Visit (INDEPENDENT_AMBULATORY_CARE_PROVIDER_SITE_OTHER): Payer: 59 | Admitting: Infectious Diseases

## 2011-11-06 ENCOUNTER — Encounter: Payer: Self-pay | Admitting: Infectious Diseases

## 2011-11-06 VITALS — BP 117/75 | HR 71 | Temp 97.8°F | Ht 74.0 in | Wt 218.0 lb

## 2011-11-06 DIAGNOSIS — B20 Human immunodeficiency virus [HIV] disease: Secondary | ICD-10-CM

## 2011-11-06 NOTE — Progress Notes (Signed)
  Subjective:    Patient ID: James Terrell, male    DOB: 09-20-1955, 56 y.o.   MRN: [id number]  HPI 56 yo M HIV+ diagnosed in1997 (1st care in [facility] with AZT then dual therapy with AZT/? (stopped in 2001). 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 He had been off rx until earlier this year when started on complera due to decreasing CD4.   HIV 1 RNA Quant (copies/mL)  Date Value  08/21/2011 110401*  04/05/2011 136000*  12/27/2010 74500*     CD4 T Cell Abs (cmm)  Date Value  08/21/2011 370*  04/05/2011 450   12/27/2010 490     Has been taking complera well, missed 1 dose. Hasn't noticed ADR except he feels sleepy. Takes his xanax and lexapro in AM, complera in pm. Worried he is getting fat.     Review of Systems     Objective:   Physical Exam  Constitutional: He appears well-developed and well-nourished.  HENT:  Mouth/Throat: No oropharyngeal exudate.  Eyes: EOM are normal. Pupils are equal, round, and reactive to light.  Neck: Neck supple.  Cardiovascular: Normal rate, regular rhythm and normal heart sounds.   Pulmonary/Chest: Effort normal and breath sounds normal.  Abdominal: Soft. Bowel sounds are normal. There is no tenderness.  Lymphadenopathy:    He has no cervical adenopathy.          Assessment & Plan:

## 2011-11-06 NOTE — Assessment & Plan Note (Signed)
He appears to be doing well with complera. Will continue and check his CD4 and VL, geno today. Will see him back in 3-4 months. He can call back and get his results at end of week.

## 2011-11-07 LAB — HIV-1 RNA ULTRAQUANT REFLEX TO GENTYP+: HIV 1 RNA Quant: 75 copies/mL — ABNORMAL HIGH (ref <20)

## 2011-11-07 LAB — T-HELPER CELL (CD4) - (RCID CLINIC ONLY): CD4 % Helper T Cell: 19 % — ABNORMAL LOW (ref 33–55)

## 2011-11-13 ENCOUNTER — Telehealth: Payer: Self-pay | Admitting: Internal Medicine

## 2011-11-13 NOTE — Telephone Encounter (Signed)
Dr jenkins agrees 

## 2011-11-13 NOTE — Telephone Encounter (Signed)
Caller: James Terrell/Patient; PCP: Darryll Capers; CB#: [phone]; Patient calling about diarrhea.  4-5 stools on 6/13, 14 and 15; one on 6/16 and 6/17.  Asks if it may be related to Rx he started in March- Complera.  Stools float and are very soft, malodorous.    Appt. w/ Dr.Kwiatokowski on 11/14/11 at 1300 per Diarrhea protocol.

## 2011-11-14 ENCOUNTER — Ambulatory Visit: Payer: 59 | Admitting: Internal Medicine

## 2012-01-01 ENCOUNTER — Ambulatory Visit (INDEPENDENT_AMBULATORY_CARE_PROVIDER_SITE_OTHER): Payer: 59 | Admitting: Infectious Diseases

## 2012-01-01 ENCOUNTER — Encounter: Payer: Self-pay | Admitting: Infectious Diseases

## 2012-01-01 VITALS — BP 114/72 | HR 76 | Temp 97.8°F | Ht 74.5 in | Wt 224.0 lb

## 2012-01-01 DIAGNOSIS — B029 Zoster without complications: Secondary | ICD-10-CM | POA: Insufficient documentation

## 2012-01-01 MED ORDER — FAMCICLOVIR 125 MG PO TABS: 500.0000 mg | ORAL_TABLET | Freq: Three times a day (TID) | ORAL | Status: DC

## 2012-01-01 MED ORDER — HYDROCODONE-ACETAMINOPHEN 5-500 MG PO TABS: 1.0000 | ORAL_TABLET | Freq: Four times a day (QID) | ORAL | Status: AC | PRN

## 2012-01-01 MED ORDER — AMITRIPTYLINE HCL 25 MG PO TABS: ORAL_TABLET | ORAL | Status: DC

## 2012-01-01 MED ORDER — FAMCICLOVIR 500 MG PO TABS: 500.0000 mg | ORAL_TABLET | Freq: Three times a day (TID) | ORAL | Status: AC

## 2012-01-01 NOTE — Progress Notes (Signed)
  Subjective:    Patient ID: James Terrell, male    DOB: [birth year], 56 y.o.   MRN: [id number]  HPI 56 yo M HIV+ diagnosed in1997 (1st care in [facility] with AZT then dual therapy with AZT/? (stopped in 2001). 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 He had been off rx until earlier this year when started on complera due to decreasing CD4.  Has been having blistering lesions all over skin, started on his R gluteal area, has had on his private parts as well. Was having pain in his R leg for 1.5 weeks prior to outbreak (thought he had poison ivy/oak). Has been having fevers.  Was seen in clinic and started on FMV. No pain medications except tylenol for fevers.   HIV 1 RNA Quant (copies/mL)  Date Value  11/06/2011 75*  08/21/2011 110401*  04/05/2011 136000*     CD4 T Cell Abs (cmm)  Date Value  11/06/2011 520   08/21/2011 370*  04/05/2011 450       Review of Systems  Constitutional: Positive for fever.  Eyes: Negative for visual disturbance.  Respiratory: Negative for cough and shortness of breath.   Genitourinary: Positive for genital sores and penile pain.  Neurological: Negative for headaches.       Objective:   Physical Exam  Constitutional: He appears well-developed and well-nourished.  HENT:  Mouth/Throat: No oropharyngeal exudate.  Eyes: EOM are normal. Pupils are equal, round, and reactive to light.  Cardiovascular: Normal rate, regular rhythm and normal heart sounds.   Pulmonary/Chest: Effort normal and breath sounds normal.  Abdominal: Soft. Bowel sounds are normal. He exhibits no distension. There is no tenderness.  Skin:             Assessment & Plan:

## 2012-01-01 NOTE — Assessment & Plan Note (Addendum)
He is acute phase- will re-write his famvir (he spilled bottle and lost some of his pills), write him rx for low dose elavil, write him for narcotic pain rx. Spoke with him that blistering should be gone by end of week, scabs and pain gone by end of month. His lesions do not look infected, suspect his fevers are due to VZV. rtc 6 weeks, sooner if continued fever.

## 2012-02-12 ENCOUNTER — Encounter: Payer: Self-pay | Admitting: Internal Medicine

## 2012-02-12 ENCOUNTER — Ambulatory Visit (INDEPENDENT_AMBULATORY_CARE_PROVIDER_SITE_OTHER): Payer: 59 | Admitting: Internal Medicine

## 2012-02-12 VITALS — BP 122/70 | Temp 98.4°F | Wt 224.0 lb

## 2012-02-12 DIAGNOSIS — J209 Acute bronchitis, unspecified: Secondary | ICD-10-CM

## 2012-02-12 DIAGNOSIS — B20 Human immunodeficiency virus [HIV] disease: Secondary | ICD-10-CM

## 2012-02-12 MED ORDER — AZITHROMYCIN 250 MG PO TABS: ORAL_TABLET | ORAL | Status: DC

## 2012-02-12 MED ORDER — DESONIDE 0.05 % EX CREA: TOPICAL_CREAM | Freq: Two times a day (BID) | CUTANEOUS | Status: DC

## 2012-02-12 MED ORDER — HYDROCODONE-HOMATROPINE 5-1.5 MG/5ML PO SYRP: 5.0000 mL | ORAL_SOLUTION | Freq: Four times a day (QID) | ORAL | Status: AC | PRN

## 2012-02-12 NOTE — Progress Notes (Signed)
Subjective:    Patient ID: James Terrell, male    DOB: [birth year], 56 y.o.   MRN: [id number]  HPI  56 year old patient who is seen today for followup. He presents with a three-day history of chest congestion and productive cough he has been expectorating green sputum. He does have a history of both acute and chronic bronchitis as well as HIV disease. He has had some fever and chills and occasional sweats over the weekend especially during the night. Denies any shortness of breath chest pain or wheezing  Past Medical History  Diagnosis Date  . HIV (human immunodeficiency virus infection) in 1997    Primary did diagnosis, 1st care in [facility] with AZT then dual therapy with retrovir and another stopped in 2001 and has had stable levels  . Anxiety   . Spontaneous pneumothorax   . Onychomycosis     tonnails  . Smoker   . Bronchitis, chronic     History   Social History  . Marital Status: Single    Spouse Name: N/A    Number of Children: N/A  . Years of Education: N/A   Occupational History  . Not on file.   Social History Main Topics  . Smoking status: Current Every Day Smoker -- 1.0 packs/day for 25 years    Types: Cigarettes  . Smokeless tobacco: Never Used  . Alcohol Use: No     occasional  . Drug Use: No  . Sexually Active: No     Pt. declined condoms   Other Topics Concern  . Not on file   Social History Narrative  . No narrative on file    Past Surgical History  Procedure Date  . Chest tube insertion     Family History  Problem Relation Age of Onset  . Emphysema Father   . COPD Father     No Known Allergies  Current Outpatient Prescriptions on File Prior to Visit  Medication Sig Dispense Refill  . ALPRAZolam (XANAX) 0.5 MG tablet Take 1 tablet (0.5 mg total) by mouth 2 (two) times daily as needed (90 day supply).  180 tablet  1  . amitriptyline (ELAVIL) 25 MG tablet 1 tab po qhs  30 tablet  1  . Emtricitab-Rilpivir-Tenofovir 200-25-300 MG  TABS Take 1 tablet by mouth daily after supper.  90 tablet  3  . escitalopram (LEXAPRO) 10 MG tablet Take 1 tablet (10 mg total) by mouth daily.  90 tablet  3    BP 122/70  Temp 98.4 F (36.9 C) (Oral)  Wt 224 lb (101.606 kg)       Review of Systems  Constitutional: Positive for chills and fatigue. Negative for fever and appetite change.  HENT: Negative for hearing loss, ear pain, congestion, sore throat, trouble swallowing, neck stiffness, dental problem, voice change and tinnitus.   Eyes: Negative for pain, discharge and visual disturbance.  Respiratory: Positive for cough. Negative for chest tightness, wheezing and stridor.   Cardiovascular: Negative for chest pain, palpitations and leg swelling.  Gastrointestinal: Negative for nausea, vomiting, abdominal pain, diarrhea, constipation, blood in stool and abdominal distention.  Genitourinary: Negative for urgency, hematuria, flank pain, discharge, difficulty urinating and genital sores.  Musculoskeletal: Negative for myalgias, back pain, joint swelling, arthralgias and gait problem.  Skin: Negative for rash.  Neurological: Negative for dizziness, syncope, speech difficulty, weakness, numbness and headaches.  Hematological: Negative for adenopathy. Does not bruise/bleed easily.  Psychiatric/Behavioral: Negative for behavioral problems and dysphoric mood. The patient is not  nervous/anxious.        Objective:   Physical Exam  Constitutional: He is oriented to person, place, and time. He appears well-developed.  HENT:  Head: Normocephalic.  Right Ear: External ear normal.  Left Ear: External ear normal.  Eyes: Conjunctivae normal and EOM are normal.  Neck: Normal range of motion.  Cardiovascular: Normal rate and normal heart sounds.   Pulmonary/Chest: Effort normal. No respiratory distress. He has no wheezes.       Bilateral rhonchi left greater than the right O2 saturation 97% Pulse mid 70s  Abdominal: Bowel sounds are  normal.  Musculoskeletal: Normal range of motion. He exhibits no edema and no tenderness.  Neurological: He is alert and oriented to person, place, and time.  Psychiatric: He has a normal mood and affect. His behavior is normal.          Assessment & Plan:   Acute bronchitis History of HIV disease   We'll treat with azithromycin as well as expectorants

## 2012-02-12 NOTE — Patient Instructions (Signed)
Mucinex DM one twice daily  Use saline irrigation, warm  moist compresses and over-the-counter decongestants only as directed.  Call if there is no improvement in 5 to 7 days, or sooner if you develop increasing pain, fever, or any new symptoms.  Take your antibiotic as prescribed until ALL of it is gone, but stop if you develop a rash, swelling, or any side effects of the medication.  Contact our office as soon as possible if  there are side effects of the medication.  Call or return to clinic prn if these symptoms worsen or fail to improve as anticipated.

## 2012-03-08 ENCOUNTER — Other Ambulatory Visit: Payer: Self-pay | Admitting: Infectious Diseases

## 2012-03-08 DIAGNOSIS — B20 Human immunodeficiency virus [HIV] disease: Secondary | ICD-10-CM

## 2012-03-18 ENCOUNTER — Other Ambulatory Visit: Payer: 59

## 2012-03-18 DIAGNOSIS — B20 Human immunodeficiency virus [HIV] disease: Secondary | ICD-10-CM

## 2012-03-18 LAB — CBC WITH DIFFERENTIAL/PLATELET
Eosinophils Absolute: 0.4 10*3/uL (ref 0.0–0.7)
Eosinophils Relative: 5 % (ref 0–5)
HCT: 46 % (ref 39.0–52.0)
Lymphocytes Relative: 38 % (ref 12–46)
Lymphs Abs: 3.1 10*3/uL (ref 0.7–4.0)
MCH: 33.3 pg (ref 26.0–34.0)
MCV: 92.4 fL (ref 78.0–100.0)
Monocytes Absolute: 0.4 10*3/uL (ref 0.1–1.0)
Monocytes Relative: 4 % (ref 3–12)
Platelets: 161 10*3/uL (ref 150–400)
RBC: 4.98 MIL/uL (ref 4.22–5.81)
WBC: 8.1 10*3/uL (ref 4.0–10.5)

## 2012-03-18 LAB — COMPREHENSIVE METABOLIC PANEL
Alkaline Phosphatase: 90 U/L (ref 39–117)
Creat: 1.01 mg/dL (ref 0.50–1.35)
Glucose, Bld: 82 mg/dL (ref 70–99)
Sodium: 142 mEq/L (ref 135–145)
Total Bilirubin: 0.5 mg/dL (ref 0.3–1.2)
Total Protein: 7.1 g/dL (ref 6.0–8.3)

## 2012-03-20 LAB — HIV-1 RNA QUANT-NO REFLEX-BLD: HIV-1 RNA Quant, Log: <1.3 {Log} (ref <1.30)

## 2012-04-01 ENCOUNTER — Ambulatory Visit (INDEPENDENT_AMBULATORY_CARE_PROVIDER_SITE_OTHER): Payer: 59 | Admitting: Infectious Diseases

## 2012-04-01 ENCOUNTER — Encounter: Payer: Self-pay | Admitting: Infectious Diseases

## 2012-04-01 VITALS — BP 128/86 | HR 84 | Temp 97.4°F | Ht 74.5 in | Wt 223.0 lb

## 2012-04-01 DIAGNOSIS — B20 Human immunodeficiency virus [HIV] disease: Secondary | ICD-10-CM

## 2012-04-01 DIAGNOSIS — Z79899 Other long term (current) drug therapy: Secondary | ICD-10-CM

## 2012-04-01 DIAGNOSIS — Z23 Encounter for immunization: Secondary | ICD-10-CM

## 2012-04-01 DIAGNOSIS — L989 Disorder of the skin and subcutaneous tissue, unspecified: Secondary | ICD-10-CM

## 2012-04-01 DIAGNOSIS — Z113 Encounter for screening for infections with a predominantly sexual mode of transmission: Secondary | ICD-10-CM

## 2012-04-01 DIAGNOSIS — K047 Periapical abscess without sinus: Secondary | ICD-10-CM

## 2012-04-01 DIAGNOSIS — H6120 Impacted cerumen, unspecified ear: Secondary | ICD-10-CM

## 2012-04-01 NOTE — Assessment & Plan Note (Signed)
His face appears consistent with rosacea. Will defer to derm with whom he has f/u.

## 2012-04-01 NOTE — Assessment & Plan Note (Signed)
He is doing very well. Continue to take complera at HS. Will see him back in 6 months. Offered/refused condoms.

## 2012-04-01 NOTE — Assessment & Plan Note (Signed)
Advised pt to wash his ear with warm water while in shower. Allow ear to drain after showering.

## 2012-04-01 NOTE — Assessment & Plan Note (Signed)
Appreciate dental f/u. Not sure why he has had quickly progressing cavity.

## 2012-04-01 NOTE — Progress Notes (Signed)
  Subjective:    Patient ID: James Terrell, male    DOB: 02-26-1956, 56 y.o.   MRN: [id number]  HPI 56 yo M HIV+ diagnosed in1997 (1st care in [facility] with AZT then dual therapy with AZT/? (stopped in 2001). 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 He had been off rx until earlier this year when started on complera due to decreasing CD4.  He was seen in August 2013 with shingles, given valtrex. Has since resolved.  Has now noted erythema on face over cheeks and nasal bridge.  Constantly feels like there is water in his L ear. Not sure if he has sinus problems.  No problems with complera. Has gained abd weight. Feels like the medicine makes him "go into a coma", goes to sleep quickly after and sleeps all night.  Recently had large, rapidly growing cavity. Is now on prescription toothpaste.  Otherwise feels good.  HIV 1 RNA Quant (copies/mL)  Date Value  03/18/2012 <20   11/06/2011 75*  08/21/2011 110401*     CD4 T Cell Abs (cmm)  Date Value  03/18/2012 540   11/06/2011 520   08/21/2011 370*     Review of Systems     Objective:   Physical Exam  Constitutional: He appears well-developed and well-nourished.  HENT:  Ears:  Mouth/Throat: No oropharyngeal exudate.  Eyes: EOM are normal. Pupils are equal, round, and reactive to light.  Neck: Neck supple.  Cardiovascular: Normal rate, regular rhythm and normal heart sounds.   Pulmonary/Chest: Effort normal and breath sounds normal.  Abdominal: Soft. Bowel sounds are normal. He exhibits no distension. There is no tenderness.  Lymphadenopathy:    He has no cervical adenopathy.          Assessment & Plan:

## 2012-06-04 ENCOUNTER — Ambulatory Visit (INDEPENDENT_AMBULATORY_CARE_PROVIDER_SITE_OTHER): Payer: 59 | Admitting: Family Medicine

## 2012-06-04 ENCOUNTER — Encounter: Payer: Self-pay | Admitting: Family Medicine

## 2012-06-04 VITALS — BP 108/74 | HR 75 | Temp 98.1°F | Wt 229.0 lb

## 2012-06-04 DIAGNOSIS — J329 Chronic sinusitis, unspecified: Secondary | ICD-10-CM

## 2012-06-04 MED ORDER — AMOXICILLIN 875 MG PO TABS: 875.0000 mg | ORAL_TABLET | Freq: Two times a day (BID) | ORAL | Status: DC

## 2012-06-04 MED ORDER — BENZONATATE 100 MG PO CAPS: 100.0000 mg | ORAL_CAPSULE | Freq: Three times a day (TID) | ORAL | Status: DC | PRN

## 2012-06-04 NOTE — Progress Notes (Signed)
Chief Complaint  Patient presents with  . Sore Throat    sinus since Dec 23rd.     HPI:  Acute visit for sinus congestion: -started: started 05/20/12 -symptoms:nasal congestion and PND, sore throat, cough, R sided sinus pain -denies:fever, SOB, NVD, tooth pain, denies flu, strep or mono exposure - otherwise has felt well -has tried: musinex -sick contacts: everyone with colds -Hx of: hx of bronchitis, no hx of asthma -has HIV followed by Dr. Ninetta Lights and levels always good he reports - reviewed last OV notes   ROS: See pertinent positives and negatives per HPI.  Past Medical History  Diagnosis Date  . HIV (human immunodeficiency virus infection) in 1997    Primary did diagnosis, 1st care in [facility] with AZT then dual therapy with retrovir and another stopped in 2001 and has had stable levels  . Anxiety   . Spontaneous pneumothorax   . Onychomycosis     tonnails  . Smoker   . Bronchitis, chronic     Family History  Problem Relation Age of Onset  . Emphysema Father   . COPD Father     History   Social History  . Marital Status: Single    Spouse Name: N/A    Number of Children: N/A  . Years of Education: N/A   Social History Main Topics  . Smoking status: Current Every Day Smoker -- 1.0 packs/day for 25 years    Types: Cigarettes  . Smokeless tobacco: Never Used  . Alcohol Use: No     Comment: occasional  . Drug Use: No  . Sexually Active: No     Comment: Pt. declined condoms   Other Topics Concern  . None   Social History Narrative  . None    Current outpatient prescriptions:ALPRAZolam (XANAX) 0.5 MG tablet, Take 1 tablet (0.5 mg total) by mouth 2 (two) times daily as needed (90 day supply)., Disp: 180 tablet, Rfl: 1;  desonide (DESOWEN) 0.05 % cream, Apply topically 2 (two) times daily., Disp: 30 g, Rfl: 0;  Emtricitab-Rilpivir-Tenofovir 200-25-300 MG TABS, Take 1 tablet by mouth daily after supper., Disp: 90 tablet, Rfl: 3 escitalopram  (LEXAPRO) 10 MG tablet, Take 1 tablet (10 mg total) by mouth daily., Disp: 90 tablet, Rfl: 3;  amoxicillin (AMOXIL) 875 MG tablet, Take 1 tablet (875 mg total) by mouth 2 (two) times daily., Disp: 20 tablet, Rfl: 0;  benzonatate (TESSALON PERLES) 100 MG capsule, Take 1 capsule (100 mg total) by mouth 3 (three) times daily as needed for cough., Disp: 20 capsule, Rfl: 0  EXAM:  Filed Vitals:   06/04/12 1101  BP: 108/74  Pulse: 75  Temp: 98.1 F (36.7 C)    There is no height on file to calculate BMI.  GENERAL: vitals reviewed and listed above, alert, oriented, appears well hydrated and in no acute distress  HEENT: atraumatic, conjunttiva clear, no obvious abnormalities on inspection of external nose and ears, normal appearance of ear canals and TMs, clear nasal congestion, mild post oropharyngeal erythema with PND, no tonsillar edema or exudate, no sinus TTP  NECK: no obvious masses on inspection  LUNGS: clear to auscultation bilaterally, no wheezes, rales or rhonchi, good air movement  CV: HRRR, no peripheral edema  MS: moves all extremities without noticeable abnormality  PSYCH: pleasant and cooperative, no obvious depression or anxiety  ASSESSMENT AND PLAN:  Discussed the following assessment and plan:  1. Sinusitis  amoxicillin (AMOXIL) 875 MG tablet, benzonatate (TESSALON PERLES) 100 MG capsule   -  going on 2 weeks with sinus pain, abx reasonable - discussed options and risks. Return precuations. -Patient advised to return or notify a doctor immediately if symptoms worsen or persist or new concerns arise.  There are no Patient Instructions on file for this visit.   Kriste Basque R.

## 2012-06-04 NOTE — Patient Instructions (Addendum)
INSTRUCTIONS FOR UPPER RESPIRATORY INFECTION:  -plenty of rest and fluids  -take antibiotic as instructed -As we discussed, we have prescribed a new medication (AMOXICILLIN) for you at this appointment. We discussed the common and serious potential adverse effects of this medication and you can review these and more with the pharmacist when you pick up your medication.  Please follow the instructions for use carefully and notify us immediately if you have any problems taking this medication.   -nasal saline wash 2-3 times daily (use prepackaged nasal saline or bottled/distilled water if making your own)   -can use sinex nasal spray for drainage and nasal congestion - but do NOT use longer then 3-4 days  -can use tylenol or ibuprofen as directed for aches and sorethroat  -in the winter time, using a humidifier at night is helpful (please follow cleaning instructions)  -if you are taking a cough medication - use only as directed, may also try a teaspoon of honey to coat the throat and throat lozenges  -for sore throat, salt water gargles can help  -follow up if you have fevers, facial pain, tooth pain, difficulty breathing or are worsening or not getting better in 5-7 days

## 2012-07-30 ENCOUNTER — Other Ambulatory Visit: Payer: Self-pay | Admitting: Infectious Diseases

## 2012-10-14 ENCOUNTER — Other Ambulatory Visit: Payer: 59

## 2012-10-15 ENCOUNTER — Other Ambulatory Visit: Payer: Self-pay | Admitting: Infectious Disease

## 2012-10-15 ENCOUNTER — Other Ambulatory Visit (INDEPENDENT_AMBULATORY_CARE_PROVIDER_SITE_OTHER): Payer: 59

## 2012-10-15 DIAGNOSIS — B20 Human immunodeficiency virus [HIV] disease: Secondary | ICD-10-CM

## 2012-10-15 DIAGNOSIS — Z79899 Other long term (current) drug therapy: Secondary | ICD-10-CM

## 2012-10-15 DIAGNOSIS — Z113 Encounter for screening for infections with a predominantly sexual mode of transmission: Secondary | ICD-10-CM

## 2012-10-15 LAB — COMPREHENSIVE METABOLIC PANEL
ALT: 18 U/L (ref 0–53)
Albumin: 4.1 g/dL (ref 3.5–5.2)
BUN: 8 mg/dL (ref 6–23)
CO2: 28 mEq/L (ref 19–32)
Calcium: 9.2 mg/dL (ref 8.4–10.5)
Chloride: 106 mEq/L (ref 96–112)
Creat: 0.98 mg/dL (ref 0.50–1.35)
Potassium: 4 mEq/L (ref 3.5–5.3)

## 2012-10-15 LAB — LIPID PANEL
Cholesterol: 167 mg/dL (ref 0–200)
HDL: 34 mg/dL — ABNORMAL LOW (ref >39)
Total CHOL/HDL Ratio: 4.9 Ratio

## 2012-10-15 LAB — CBC
HCT: 47.3 % (ref 39.0–52.0)
Hemoglobin: 16.9 g/dL (ref 13.0–17.0)
MCV: 95 fL (ref 78.0–100.0)
RBC: 4.98 MIL/uL (ref 4.22–5.81)
WBC: 7.6 10*3/uL (ref 4.0–10.5)

## 2012-10-16 LAB — T-HELPER CELL (CD4) - (RCID CLINIC ONLY): CD4 % Helper T Cell: 19 % — ABNORMAL LOW (ref 33–55)

## 2012-10-28 ENCOUNTER — Encounter: Payer: Self-pay | Admitting: Infectious Diseases

## 2012-10-28 ENCOUNTER — Ambulatory Visit (INDEPENDENT_AMBULATORY_CARE_PROVIDER_SITE_OTHER): Payer: 59 | Admitting: Infectious Diseases

## 2012-10-28 VITALS — BP 119/74 | HR 68 | Temp 97.8°F | Ht 74.0 in | Wt 225.0 lb

## 2012-10-28 DIAGNOSIS — R079 Chest pain, unspecified: Secondary | ICD-10-CM | POA: Insufficient documentation

## 2012-10-28 DIAGNOSIS — F17200 Nicotine dependence, unspecified, uncomplicated: Secondary | ICD-10-CM

## 2012-10-28 DIAGNOSIS — B20 Human immunodeficiency virus [HIV] disease: Secondary | ICD-10-CM

## 2012-10-28 NOTE — Progress Notes (Signed)
  Subjective:    Patient ID: James Terrell, male    DOB: 1955-11-27, 57 y.o.   MRN: [id number]  HPI 57 yo M HIV+ diagnosed in1997 (1st care in [facility] with AZT then dual therapy with AZT/? (stopped in 2001). 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 Previously started on complera.   He was seen in August 2013 with shingles.  HIV 1 RNA Quant (copies/mL)  Date Value  10/15/2012 <20   03/18/2012 <20   11/06/2011 75*     CD4 T Cell Abs (cmm)  Date Value  10/15/2012 530   03/18/2012 540   11/06/2011 520    On May 19 had episode of L anterior CP. Had sharp pain which took his breath away. Lasted 30-60 seconds. Improved to with massage. Improved with taking a nap as well. No numbness or tingling. Radiation into neck, arm, or back (-). Diaphoresis (+). No dysgeusia (had a milk shake 30 minutes prior). Had finished 2 week prednisone taper 3 days prior.   Hx+ of tobacco use, grandfather MI 90, paternal uncle MI/found dead 80, paternal aunt with MI, dad died at 84.   Review of Systems See above.     Objective:   Physical Exam  Constitutional: He appears well-developed and well-nourished.  HENT:  Mouth/Throat: No oropharyngeal exudate.  Eyes: EOM are normal. Pupils are equal, round, and reactive to light.  Neck: Neck supple.  Cardiovascular: Normal rate, regular rhythm and normal heart sounds.   Pulmonary/Chest: Effort normal and breath sounds normal. He exhibits no tenderness.  Abdominal: Soft. Bowel sounds are normal. There is no tenderness.  Lymphadenopathy:    He has no cervical adenopathy.          Assessment & Plan:

## 2012-10-28 NOTE — Assessment & Plan Note (Signed)
He's doing very well. Offered/refused condoms. Will see him back in 6 months.

## 2012-10-28 NOTE — Assessment & Plan Note (Signed)
Will have him seen by CV. His cp is atypical, more likely related to milkshake/dyspepsia. His lipids are not grossly abn, BP ok. But he is a smoker and has FHx (non 1st degree).

## 2012-10-28 NOTE — Assessment & Plan Note (Signed)
Encouraged to quit. 

## 2012-10-30 ENCOUNTER — Telehealth: Payer: Self-pay | Admitting: *Deleted

## 2012-10-30 ENCOUNTER — Encounter: Payer: Self-pay | Admitting: Infectious Diseases

## 2012-10-30 NOTE — Telephone Encounter (Signed)
Called patient and left a message to advise him that his Cardiology referral appt was made at [facility] for Tuesday 11/12/12 at 945 am with Dr Jens Som gave them the number [phone] and advised they will be sending him an information packet with appt info and address. Advised him if he has any questions to please call the office.

## 2012-11-05 ENCOUNTER — Ambulatory Visit: Payer: 59 | Admitting: Cardiology

## 2012-11-12 ENCOUNTER — Encounter: Payer: Self-pay | Admitting: Cardiology

## 2012-11-12 ENCOUNTER — Ambulatory Visit (INDEPENDENT_AMBULATORY_CARE_PROVIDER_SITE_OTHER): Payer: 59 | Admitting: Cardiology

## 2012-11-12 VITALS — BP 120/80 | HR 67 | Wt 223.0 lb

## 2012-11-12 DIAGNOSIS — R079 Chest pain, unspecified: Secondary | ICD-10-CM

## 2012-11-12 DIAGNOSIS — F17200 Nicotine dependence, unspecified, uncomplicated: Secondary | ICD-10-CM

## 2012-11-12 NOTE — Assessment & Plan Note (Signed)
Symptoms atypical. Will arrange exercise treadmill for risk stratification. 

## 2012-11-12 NOTE — Progress Notes (Signed)
  HPI: 57 year old male for evaluation of chest pain. No prior cardiac history. Patient denies dyspnea on exertion, orthopnea, PND, pedal edema, syncope or exertional chest pain. On May 19 while driving he developed a sudden pain in the left chest area. The pain did not radiate. He felt the sensation that he couldn't take a deep breath. The symptoms lasted approximately 10 minutes. He went home and took a TUMS and fell asleep and afterwards he had no pain. No pain since then. Because of the above we are asked to evaluate.  Current Outpatient Prescriptions  Medication Sig Dispense Refill  . ALPRAZolam (XANAX) 0.5 MG tablet Take 1 tablet (0.5 mg total) by mouth 2 (two) times daily as needed (90 day supply).  180 tablet  1  . COMPLERA 200-25-300 MG TABS Take 1 tablet by mouth  daily after supper  30 tablet  5  . escitalopram (LEXAPRO) 10 MG tablet Take 1 tablet (10 mg total) by mouth daily.  90 tablet  3   No current facility-administered medications for this visit.    No Known Allergies  Past Medical History  Diagnosis Date  . HIV (human immunodeficiency virus infection) in 1997    Primary did diagnosis, 1st care in [facility] with AZT then dual therapy with retrovir and another stopped in 2001 and has had stable levels  . Anxiety   . Spontaneous pneumothorax   . Onychomycosis     tonnails  . Smoker   . Bronchitis, chronic   . Pneumonia     Past Surgical History  Procedure Laterality Date  . Chest tube insertion      History   Social History  . Marital Status: Single    Spouse Name: N/A    Number of Children: N/A  . Years of Education: N/A   Occupational History  . Not on file.   Social History Main Topics  . Smoking status: Current Every Day Smoker -- 1.00 packs/day for 25 years    Types: Cigarettes  . Smokeless tobacco: Never Used  . Alcohol Use: 0.6 oz/week    1 Glasses of wine per week     Comment: occasional  . Drug Use: No  . Sexually Active: No   Comment: Pt. declined condoms   Other Topics Concern  . Not on file   Social History Narrative  . No narrative on file    Family History  Problem Relation Age of Onset  . Emphysema Father   . COPD Father   . Heart disease Paternal Aunt   . Heart disease Paternal Uncle   . Heart disease Paternal Grandfather     ROS: no fevers or chills, productive cough, hemoptysis, dysphasia, odynophagia, melena, hematochezia, dysuria, hematuria, rash, seizure activity, orthopnea, PND, pedal edema, claudication. Remaining systems are negative.  Physical Exam:   Blood pressure 120/80, pulse 67, weight 223 lb (101.152 kg).  General:  Well developed/well nourished in NAD Skin warm/dry Patient not depressed No peripheral clubbing Back-normal HEENT-normal/normal eyelids Neck supple/normal carotid upstroke bilaterally; no bruits; no JVD; no thyromegaly chest - CTA/ normal expansion CV - RRR/normal S1 and S2; no murmurs, rubs or gallops;  PMI nondisplaced Abdomen -NT/ND, no HSM, no mass, + bowel sounds, no bruit 2+ femoral pulses, no bruits Ext-no edema, chords, 2+ DP Neuro-grossly nonfocal  ECG NSR with no ST changes

## 2012-11-12 NOTE — Assessment & Plan Note (Signed)
Patient counseled on discontinuing his tobacco use.

## 2012-11-12 NOTE — Patient Instructions (Addendum)
Your physician recommends that you schedule a follow-up appointment in: AS NEEDED PENDING TEST RESULTS  Your physician has requested that you have an exercise tolerance test. For further information please visit [email or web address] Please also follow instruction sheet, as given.    Exercise Stress Electrocardiography An exercise stress test is a heart test (EKG) which is done while you are moving. You will walk on a treadmill. This test will tell your doctor how your heart does when it is forced to work harder and how much activity you can safely handle. BEFORE THE TEST  Wear shorts or athletic pants.  Wear comfortable tennis shoes.  Women need to wear a bra that allows patches to be put on under it. TEST  An EKG cable will be attached to your waist. This cable is hooked up to patches, which look like round stickers stuck to your chest.  You will be asked to walk on the treadmill.  You will walk until you are too tired or until you are told to stop.  Tell the doctor right away if you have:  Chest pain.  Leg cramps.  Shortness of breath.  Dizziness.  The test may last 30 minutes to 1 hour. The timing depends on your physical condition and the condition of your heart. AFTER THE TEST  You will rest for about 6 minutes. During this time, your heart rhythm and blood pressure will be checked.  The testing equipment will be removed from your body and you can get dressed.  You may go home or back to your hospital room. You may keep doing all your usual activities as told by your doctor. Finding out the results of your test Ask when your test results will be ready. Make sure you get your test results. Document Released: 11/01/2007 Document Revised: 08/07/2011 Document Reviewed: 11/01/2007 [facility] Patient Information 2014 [place], [place].

## 2012-11-18 ENCOUNTER — Other Ambulatory Visit: Payer: Self-pay | Admitting: Infectious Diseases

## 2012-11-25 ENCOUNTER — Other Ambulatory Visit: Payer: Self-pay | Admitting: *Deleted

## 2012-11-25 ENCOUNTER — Encounter: Payer: Self-pay | Admitting: Infectious Diseases

## 2012-11-25 DIAGNOSIS — F329 Major depressive disorder, single episode, unspecified: Secondary | ICD-10-CM

## 2012-11-25 MED ORDER — ESCITALOPRAM OXALATE 10 MG PO TABS: 10.0000 mg | ORAL_TABLET | Freq: Every day | ORAL | Status: DC

## 2012-11-26 ENCOUNTER — Encounter: Payer: Self-pay | Admitting: Family Medicine

## 2012-11-26 ENCOUNTER — Ambulatory Visit (INDEPENDENT_AMBULATORY_CARE_PROVIDER_SITE_OTHER): Payer: 59 | Admitting: Family Medicine

## 2012-11-26 ENCOUNTER — Ambulatory Visit (INDEPENDENT_AMBULATORY_CARE_PROVIDER_SITE_OTHER)
Admission: RE | Admit: 2012-11-26 | Discharge: 2012-11-26 | Disposition: A | Discharge status: Home / Self Care | Payer: 59 | Source: Ambulatory Visit | Attending: Family Medicine | Admitting: Family Medicine

## 2012-11-26 VITALS — BP 112/78 | HR 94 | Temp 98.3°F | Wt 223.0 lb

## 2012-11-26 DIAGNOSIS — J209 Acute bronchitis, unspecified: Secondary | ICD-10-CM

## 2012-11-26 MED ORDER — LEVOFLOXACIN 500 MG PO TABS: 500.0000 mg | ORAL_TABLET | Freq: Every day | ORAL | Status: AC

## 2012-11-26 NOTE — Progress Notes (Signed)
Quick Note:  I spoke with pt and released results in my chart ______ 

## 2012-11-26 NOTE — Progress Notes (Signed)
  Subjective:    Patient ID: James Terrell, male    DOB: [birth year], 57 y.o.   MRN: [id number]  HPI Here for one week of fevers, sweats, and coughing up yellow and green sputum. He was seen twice at [facility] over the weekend, no CXR was taken. He was diagnosed with bronchitis and was given a Zpack. He has taken 2 days of the Zpack and is not better. No NVD. Drinking fluids and taking Mucinex.    Review of Systems  Constitutional: Negative.   HENT: Negative.   Respiratory: Positive for cough. Negative for chest tightness, shortness of breath and wheezing.   Cardiovascular: Negative.        Objective:   Physical Exam  Constitutional: He appears well-developed and well-nourished. No distress.  HENT:  Right Ear: External ear normal.  Left Ear: External ear normal.  Nose: Nose normal.  Mouth/Throat: Oropharynx is clear and moist.  Eyes: Conjunctivae are normal.  Neck: No thyromegaly present.  Pulmonary/Chest: Effort normal and breath sounds normal. No respiratory distress. He has no wheezes. He has no rales.  Lymphadenopathy:    He has no cervical adenopathy.          Assessment & Plan:  This may be bronchitis but I am concerned about a possible pneumonia. We will send him for a CXR today. Switch to Levaquin.

## 2012-12-02 ENCOUNTER — Encounter: Payer: 59 | Admitting: Physician Assistant

## 2012-12-16 ENCOUNTER — Encounter: Payer: 59 | Admitting: Physician Assistant

## 2013-02-20 ENCOUNTER — Encounter: Payer: Self-pay | Admitting: Infectious Diseases

## 2013-02-24 ENCOUNTER — Other Ambulatory Visit: Payer: Self-pay | Admitting: *Deleted

## 2013-02-24 DIAGNOSIS — B20 Human immunodeficiency virus [HIV] disease: Secondary | ICD-10-CM

## 2013-02-24 MED ORDER — EMTRICITAB-RILPIVIR-TENOFOV DF 200-25-300 MG PO TABS: 1.0000 | ORAL_TABLET | Freq: Every day | ORAL | Status: DC

## 2013-03-03 ENCOUNTER — Ambulatory Visit: Payer: 59

## 2013-03-12 ENCOUNTER — Other Ambulatory Visit: Payer: Self-pay | Admitting: Infectious Diseases

## 2013-04-03 ENCOUNTER — Other Ambulatory Visit: Payer: Self-pay

## 2013-04-07 ENCOUNTER — Other Ambulatory Visit (INDEPENDENT_AMBULATORY_CARE_PROVIDER_SITE_OTHER): Payer: 59

## 2013-04-07 DIAGNOSIS — B20 Human immunodeficiency virus [HIV] disease: Secondary | ICD-10-CM

## 2013-04-07 LAB — COMPREHENSIVE METABOLIC PANEL
ALT: 11 U/L (ref 0–53)
AST: 13 U/L (ref 0–37)
Alkaline Phosphatase: 107 U/L (ref 39–117)
Calcium: 9 mg/dL (ref 8.4–10.5)
Creat: 1 mg/dL (ref 0.50–1.35)
Sodium: 139 mEq/L (ref 135–145)
Total Bilirubin: 0.5 mg/dL (ref 0.3–1.2)
Total Protein: 6.7 g/dL (ref 6.0–8.3)

## 2013-04-07 LAB — CBC
MCHC: 35.5 g/dL (ref 30.0–36.0)
Platelets: 174 10*3/uL (ref 150–400)
RDW: 14 % (ref 11.5–15.5)
WBC: 8 10*3/uL (ref 4.0–10.5)

## 2013-04-07 LAB — LIPID PANEL
LDL Cholesterol: 61 mg/dL (ref 0–99)
Total CHOL/HDL Ratio: 5.7 Ratio
VLDL: 51 mg/dL — ABNORMAL HIGH (ref 0–40)

## 2013-04-08 LAB — HIV-1 RNA QUANT-NO REFLEX-BLD
HIV 1 RNA Quant: <20 copies/mL (ref <20)
HIV-1 RNA Quant, Log: <1.3 {Log} (ref <1.30)

## 2013-04-09 LAB — T-HELPER CELL (CD4) - (RCID CLINIC ONLY)
CD4 % Helper T Cell: 21 % — ABNORMAL LOW (ref 33–55)
CD4 T Cell Abs: 630 /uL (ref 400–2700)

## 2013-04-21 ENCOUNTER — Encounter: Payer: Self-pay | Admitting: Infectious Diseases

## 2013-04-21 ENCOUNTER — Ambulatory Visit (INDEPENDENT_AMBULATORY_CARE_PROVIDER_SITE_OTHER): Payer: 59 | Admitting: Infectious Diseases

## 2013-04-21 VITALS — BP 114/73 | HR 74 | Temp 97.8°F | Ht 75.0 in | Wt 229.0 lb

## 2013-04-21 DIAGNOSIS — Z113 Encounter for screening for infections with a predominantly sexual mode of transmission: Secondary | ICD-10-CM

## 2013-04-21 DIAGNOSIS — Z79899 Other long term (current) drug therapy: Secondary | ICD-10-CM

## 2013-04-21 DIAGNOSIS — F411 Generalized anxiety disorder: Secondary | ICD-10-CM

## 2013-04-21 DIAGNOSIS — E785 Hyperlipidemia, unspecified: Secondary | ICD-10-CM

## 2013-04-21 DIAGNOSIS — Z23 Encounter for immunization: Secondary | ICD-10-CM

## 2013-04-21 DIAGNOSIS — B20 Human immunodeficiency virus [HIV] disease: Secondary | ICD-10-CM

## 2013-04-21 DIAGNOSIS — F17200 Nicotine dependence, unspecified, uncomplicated: Secondary | ICD-10-CM

## 2013-04-21 DIAGNOSIS — R1310 Dysphagia, unspecified: Secondary | ICD-10-CM

## 2013-04-21 DIAGNOSIS — L719 Rosacea, unspecified: Secondary | ICD-10-CM | POA: Insufficient documentation

## 2013-04-21 MED ORDER — METRONIDAZOLE 0.75 % EX CREA: TOPICAL_CREAM | Freq: Two times a day (BID) | CUTANEOUS | Status: DC

## 2013-04-21 MED ORDER — DESONIDE 0.05 % EX CREA: TOPICAL_CREAM | Freq: Two times a day (BID) | CUTANEOUS | Status: DC

## 2013-04-21 MED ORDER — ALPRAZOLAM 0.5 MG PO TABS: 0.2500 mg | ORAL_TABLET | Freq: Two times a day (BID) | ORAL | Status: DC | PRN

## 2013-04-21 NOTE — Addendum Note (Signed)
Addended by: Andree Coss on: 04/21/2013 12:32 PM   Modules accepted: Orders

## 2013-04-21 NOTE — Assessment & Plan Note (Signed)
Will have him come back, fasting. Encourage him to exercise 20 minutes tiw. Watch diet.

## 2013-04-21 NOTE — Progress Notes (Signed)
  Subjective:    Patient ID: James Terrell, male    DOB: 1955/12/23, 57 y.o.   MRN: [id number]  HPI 57 yo M HIV+ diagnosed in 1997 (1st care in [place] [facility] with AZT then dual therapy with AZT/? (stopped in 2001). 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 Previously started on complera.  Was eval this summer by CV for atypical CP- pt deffered stress test. Was thought to have esophageal spasm. States his food does not go down well. Has had a couple of episodes of emesis.  Also eval this summer for possible PNA and given rx for levaquin.  No problems with ART except it makes him drowsy.  Worried about his Trig. Does not watch diet. Does not exercise but is active.   HIV 1 RNA Quant (copies/mL)  Date Value  04/07/2013 <20   10/15/2012 <20   03/18/2012 <20      CD4 T Cell Abs (/uL)  Date Value  04/07/2013 630   10/15/2012 530   03/18/2012 540    Review of Systems  Constitutional: Negative for appetite change and unexpected weight change.  Respiratory: Negative for shortness of breath.   Cardiovascular: Negative for chest pain.  Gastrointestinal: Negative for diarrhea and constipation.  Genitourinary: Negative for difficulty urinating.       Objective:   Physical Exam  Constitutional: He appears well-developed and well-nourished.  HENT:  Mouth/Throat: No oropharyngeal exudate.  Eyes: EOM are normal. Pupils are equal, round, and reactive to light.  Neck: Neck supple.  Cardiovascular: Normal rate and normal heart sounds.   Pulmonary/Chest: Effort normal and breath sounds normal.  Abdominal: Soft. Bowel sounds are normal. There is no tenderness. There is no rebound.  Lymphadenopathy:    He has no cervical adenopathy.          Assessment & Plan:

## 2013-04-21 NOTE — Assessment & Plan Note (Signed)
Doing well. Given condoms. Needs to avoid PPI with his ART. Recheck his Hep B SAb. See him back in 6 months.

## 2013-04-21 NOTE — Assessment & Plan Note (Signed)
Encouraged to quit. 

## 2013-04-21 NOTE — Assessment & Plan Note (Signed)
Xanax refilled.  

## 2013-04-21 NOTE — Addendum Note (Signed)
Addended by: Wlliam Grosso C on: 04/21/2013 10:53 AM   Modules accepted: Orders

## 2013-04-21 NOTE — Assessment & Plan Note (Signed)
He has some warning sx, dysphagia. Will get him into GI

## 2013-04-21 NOTE — Assessment & Plan Note (Signed)
Will refill his topicals.

## 2013-05-06 ENCOUNTER — Encounter: Payer: Self-pay | Admitting: Internal Medicine

## 2013-06-09 ENCOUNTER — Ambulatory Visit: Payer: 59 | Admitting: Internal Medicine

## 2013-06-12 ENCOUNTER — Other Ambulatory Visit: Payer: Self-pay | Admitting: *Deleted

## 2013-06-12 DIAGNOSIS — F411 Generalized anxiety disorder: Secondary | ICD-10-CM

## 2013-06-12 MED ORDER — ESCITALOPRAM OXALATE 10 MG PO TABS: ORAL_TABLET | ORAL | Status: DC

## 2013-07-30 ENCOUNTER — Other Ambulatory Visit: Payer: Self-pay | Admitting: Infectious Diseases

## 2013-07-30 DIAGNOSIS — B20 Human immunodeficiency virus [HIV] disease: Secondary | ICD-10-CM

## 2013-09-04 ENCOUNTER — Telehealth: Payer: Self-pay | Admitting: Internal Medicine

## 2013-09-04 ENCOUNTER — Ambulatory Visit (INDEPENDENT_AMBULATORY_CARE_PROVIDER_SITE_OTHER): Payer: 59 | Admitting: Family Medicine

## 2013-09-04 ENCOUNTER — Encounter: Payer: Self-pay | Admitting: Family Medicine

## 2013-09-04 VITALS — BP 122/82 | HR 73 | Wt 232.0 lb

## 2013-09-04 DIAGNOSIS — K4090 Unilateral inguinal hernia, without obstruction or gangrene, not specified as recurrent: Secondary | ICD-10-CM

## 2013-09-04 NOTE — Progress Notes (Signed)
   Subjective:    Patient ID: James Terrell, male    DOB: January 31, 1956, 58 y.o.   MRN: [id number]  HPI Patient seen with possible left abdominal hernia. First noted 2 weeks ago. His job requires lifting. He has not had any significant pain only some mild burning. He notices that this is worse with lifting and somewhat improved with rest and ice. No prior history of hernia.  Past Medical History  Diagnosis Date  . HIV (human immunodeficiency virus infection) in 1997    Primary did diagnosis, 1st care in [facility] with AZT then dual therapy with retrovir and another stopped in 2001 and has had stable levels  . Anxiety   . Spontaneous pneumothorax   . Onychomycosis     tonnails  . Smoker   . Bronchitis, chronic   . Pneumonia    Past Surgical History  Procedure Laterality Date  . Chest tube insertion      reports that he has been smoking Cigarettes.  He has a 25 pack-year smoking history. He has never used smokeless tobacco. He reports that he does not drink alcohol or use illicit drugs. family history includes COPD in his father; Emphysema in his father; Heart disease in his paternal aunt, paternal grandfather, and paternal uncle. No Known Allergies    Review of Systems  Constitutional: Negative for fever, chills, appetite change and unexpected weight change.  Gastrointestinal: Negative for abdominal pain.  Genitourinary: Negative for dysuria.       Objective:   Physical Exam  Constitutional: He appears well-developed and well-nourished.  Cardiovascular: Normal rate and regular rhythm.   Pulmonary/Chest: Effort normal and breath sounds normal. No respiratory distress. He has no wheezes. He has no rales.  Genitourinary:  Patient has small left inguinal hernia which is soft and nontender. No inguinal adenopathy          Assessment & Plan:  Left inguinal hernia. Minimally symptomatic though patient's job requires lots of lifting. We'll set up to see general  surgeon.

## 2013-09-04 NOTE — Telephone Encounter (Signed)
Relevant patient education assigned to patient using Emmi. ° °

## 2013-09-04 NOTE — Progress Notes (Signed)
Pre visit review using our clinic review tool, if applicable. No additional management support is needed unless otherwise documented below in the visit note. 

## 2013-09-04 NOTE — Patient Instructions (Signed)

## 2013-09-17 ENCOUNTER — Ambulatory Visit (INDEPENDENT_AMBULATORY_CARE_PROVIDER_SITE_OTHER): Payer: 59 | Admitting: Surgery

## 2013-09-18 ENCOUNTER — Ambulatory Visit (INDEPENDENT_AMBULATORY_CARE_PROVIDER_SITE_OTHER): Payer: 59 | Admitting: General Surgery

## 2013-09-30 ENCOUNTER — Other Ambulatory Visit: Payer: 59

## 2013-09-30 DIAGNOSIS — Z79899 Other long term (current) drug therapy: Secondary | ICD-10-CM

## 2013-09-30 DIAGNOSIS — Z113 Encounter for screening for infections with a predominantly sexual mode of transmission: Secondary | ICD-10-CM

## 2013-09-30 DIAGNOSIS — B20 Human immunodeficiency virus [HIV] disease: Secondary | ICD-10-CM

## 2013-09-30 LAB — COMPLETE METABOLIC PANEL WITH GFR
ALBUMIN: 3.8 g/dL (ref 3.5–5.2)
ALK PHOS: 103 U/L (ref 39–117)
ALT: 10 U/L (ref 0–53)
AST: 14 U/L (ref 0–37)
BUN: 8 mg/dL (ref 6–23)
CO2: 26 mEq/L (ref 19–32)
Calcium: 9 mg/dL (ref 8.4–10.5)
Chloride: 104 mEq/L (ref 96–112)
Creat: 0.96 mg/dL (ref 0.50–1.35)
GFR, Est African American: >89 mL/min
GFR, Est Non African American: 87 mL/min
Glucose, Bld: 82 mg/dL (ref 70–99)
POTASSIUM: 3.6 meq/L (ref 3.5–5.3)
SODIUM: 139 meq/L (ref 135–145)
Total Bilirubin: 0.5 mg/dL (ref 0.2–1.2)
Total Protein: 6.6 g/dL (ref 6.0–8.3)

## 2013-09-30 LAB — CBC WITH DIFFERENTIAL/PLATELET
BASOS ABS: 0 10*3/uL (ref 0.0–0.1)
Basophils Relative: 0 % (ref 0–1)
Eosinophils Absolute: 0.3 10*3/uL (ref 0.0–0.7)
Eosinophils Relative: 4 % (ref 0–5)
HCT: 45.5 % (ref 39.0–52.0)
Hemoglobin: 16 g/dL (ref 13.0–17.0)
LYMPHS PCT: 36 % (ref 12–46)
Lymphs Abs: 3 10*3/uL (ref 0.7–4.0)
MCH: 33.2 pg (ref 26.0–34.0)
MCHC: 35.2 g/dL (ref 30.0–36.0)
MCV: 94.4 fL (ref 78.0–100.0)
MONO ABS: 0.5 10*3/uL (ref 0.1–1.0)
Monocytes Relative: 6 % (ref 3–12)
Neutro Abs: 4.4 10*3/uL (ref 1.7–7.7)
Neutrophils Relative %: 54 % (ref 43–77)
PLATELETS: 155 10*3/uL (ref 150–400)
RBC: 4.82 MIL/uL (ref 4.22–5.81)
RDW: 14 % (ref 11.5–15.5)
WBC: 8.2 10*3/uL (ref 4.0–10.5)

## 2013-09-30 LAB — LIPID PANEL
CHOL/HDL RATIO: 4.7 ratio
Cholesterol: 122 mg/dL (ref 0–200)
HDL: 26 mg/dL — ABNORMAL LOW (ref >39)
LDL Cholesterol: 56 mg/dL (ref 0–99)
TRIGLYCERIDES: 198 mg/dL — AB (ref <150)
VLDL: 40 mg/dL (ref 0–40)

## 2013-10-01 LAB — T-HELPER CELL (CD4) - (RCID CLINIC ONLY)
CD4 % Helper T Cell: 27 % — ABNORMAL LOW (ref 33–55)
CD4 T Cell Abs: 810 /uL (ref 400–2700)

## 2013-10-01 LAB — HIV-1 RNA QUANT-NO REFLEX-BLD: HIV-1 RNA Quant, Log: <1.3 {Log} (ref <1.30)

## 2013-10-01 LAB — HEPATITIS B SURFACE ANTIBODY,QUALITATIVE: HEP B S AB: POSITIVE — AB

## 2013-10-01 LAB — RPR

## 2013-10-13 ENCOUNTER — Other Ambulatory Visit: Payer: 59

## 2013-10-15 ENCOUNTER — Encounter: Payer: Self-pay | Admitting: Infectious Diseases

## 2013-10-15 ENCOUNTER — Ambulatory Visit (INDEPENDENT_AMBULATORY_CARE_PROVIDER_SITE_OTHER): Payer: 59 | Admitting: Infectious Diseases

## 2013-10-15 ENCOUNTER — Other Ambulatory Visit: Payer: Self-pay | Admitting: *Deleted

## 2013-10-15 VITALS — BP 115/78 | HR 72 | Temp 97.7°F | Ht 75.0 in | Wt 230.0 lb

## 2013-10-15 DIAGNOSIS — B20 Human immunodeficiency virus [HIV] disease: Secondary | ICD-10-CM

## 2013-10-15 DIAGNOSIS — F411 Generalized anxiety disorder: Secondary | ICD-10-CM

## 2013-10-15 DIAGNOSIS — K4090 Unilateral inguinal hernia, without obstruction or gangrene, not specified as recurrent: Secondary | ICD-10-CM

## 2013-10-15 MED ORDER — ALPRAZOLAM 0.5 MG PO TABS: 0.2500 mg | ORAL_TABLET | Freq: Two times a day (BID) | ORAL | Status: DC | PRN

## 2013-10-15 NOTE — Progress Notes (Signed)
   Subjective:    Patient ID: James Terrell, male    DOB: 22-Jan-1956, 58 y.o.   MRN: [id number]  HPI 58 yo M HIV+ diagnosed in 1997 (1st care in [place] [facility] with AZT then dual therapy with AZT/? (stopped in 2001). 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 Previously started on complera.  Was eval this summer by CV for atypical CP- pt deffered stress test. Was thought to have esophageal spasm. Has been doing well, having skin cancers removed.  Has been eval for hernia, was referred for surgical eval. 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 Has been putting ice on, does not want to see surgery. Only notices with heavy lifting.    HIV 1 RNA Quant (copies/mL)  Date Value  09/30/2013 <20   04/07/2013 <20   10/15/2012 <20      CD4 T Cell Abs (/uL)  Date Value  09/30/2013 810   04/07/2013 630   10/15/2012 530     Review of Systems  Constitutional: Negative for appetite change and unexpected weight change.  Respiratory: Negative for cough and shortness of breath.   Gastrointestinal: Negative for diarrhea and constipation.  Genitourinary: Negative for difficulty urinating.       Objective:   Physical Exam  Constitutional: He appears well-developed and well-nourished.  HENT:  Mouth/Throat: No oropharyngeal exudate.  Eyes: EOM are normal. Pupils are equal, round, and reactive to light.  Neck: Neck supple.  Cardiovascular: Normal rate, regular rhythm and normal heart sounds.   Pulmonary/Chest: Effort normal and breath sounds normal.  Abdominal: Soft. Bowel sounds are normal. There is no tenderness.    Lymphadenopathy:    He has no cervical adenopathy.          Assessment & Plan:

## 2013-10-15 NOTE — Assessment & Plan Note (Signed)
He defers surgical eval. He is given warning sign for incarceration.

## 2013-10-15 NOTE — Assessment & Plan Note (Signed)
Taking 1/2 tab of xanax /day. Refilled.

## 2013-10-15 NOTE — Assessment & Plan Note (Signed)
Doing well. Offered, refused condoms. He is Hep B immune. Will see him back in 6 months with labs prior.

## 2013-10-27 ENCOUNTER — Ambulatory Visit (INDEPENDENT_AMBULATORY_CARE_PROVIDER_SITE_OTHER): Payer: 59 | Admitting: General Surgery

## 2014-01-13 ENCOUNTER — Encounter (INDEPENDENT_AMBULATORY_CARE_PROVIDER_SITE_OTHER): Payer: Self-pay | Admitting: General Surgery

## 2014-01-13 ENCOUNTER — Ambulatory Visit (INDEPENDENT_AMBULATORY_CARE_PROVIDER_SITE_OTHER): Payer: 59 | Admitting: General Surgery

## 2014-01-13 VITALS — BP 126/68 | HR 76 | Ht 75.0 in | Wt 228.2 lb

## 2014-01-13 DIAGNOSIS — K4090 Unilateral inguinal hernia, without obstruction or gangrene, not specified as recurrent: Secondary | ICD-10-CM

## 2014-01-13 NOTE — Progress Notes (Signed)
Patient ID: James Terrell, male   DOB: [birth year], 58 y.o.   MRN: [id number]  Chief Complaint  Patient presents with  . Hernia    HPI James Terrell is a 58 y.o. male.  The patient is a 58 year old male who is referred by Dr. Jillyn Hidden for evaluation of a left inguinal hernia. The patient states he's noticed the last 6 months. 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 He states that he has burning inguinal pain with standing for long periods of time. He states he is able to reduce this on his own.   The patient does state he smokes one half pack a day of cigarettes.  HPI  Past Medical History  Diagnosis Date  . HIV (human immunodeficiency virus infection) in 1997    Primary did diagnosis, 1st care in [place] with AZT then dual therapy with retrovir and another stopped in 2001 and has had stable levels  . Anxiety   . Spontaneous pneumothorax   . Onychomycosis     tonnails  . Smoker   . Bronchitis, chronic   . Pneumonia     Past Surgical History  Procedure Laterality Date  . Chest tube insertion      Family History  Problem Relation Age of Onset  . Emphysema Father   . COPD Father   . Heart disease Paternal Aunt   . Heart disease Paternal Uncle   . Heart disease Paternal Grandfather     Social History History  Substance Use Topics  . Smoking status: Current Every Day Smoker -- 2.00 packs/day for 39 years    Types: Cigarettes  . Smokeless tobacco: Never Used  . Alcohol Use: No     Comment: occasional    No Known Allergies  Current Outpatient Prescriptions  Medication Sig Dispense Refill  . ALPRAZolam (XANAX) 0.5 MG tablet Take 0.5 tablets (0.25 mg total) by mouth 2 (two) times daily as needed (90 day supply).  90 tablet  1  . COMPLERA 200-25-300 MG TABS Take 1 tablet by mouth  daily after supper  30 tablet  11  . desonide (DESOWEN) 0.05 % cream Apply topically 2 (two) times daily.  30 g  0  .  escitalopram (LEXAPRO) 10 MG tablet Take 1 tablet by mouth  daily  30 tablet  11  . metroNIDAZOLE (METROCREAM) 0.75 % cream Apply topically 2 (two) times daily.  45 g  0   No current facility-administered medications for this visit.    Review of Systems Review of Systems  Constitutional: Negative.   HENT: Negative.   Eyes: Negative.   Respiratory: Negative.   Cardiovascular: Negative.   Gastrointestinal: Negative.   Endocrine: Negative.   Neurological: Negative.     Blood pressure 126/68, pulse 76, height 6\' 3"  (1.905 m), weight 228 lb 4 oz (103.534 kg).  Physical Exam Physical Exam  Abdominal: Soft. Bowel sounds are normal. A hernia is present. Hernia confirmed positive in the left inguinal area. Hernia confirmed negative in the right inguinal area.    Data Reviewed none  Assessment    58 year old male with a left inguinal hernia, reducible     Plan    1. I did discuss the procedure in detail with the patient as well as the postoperative recovery. Patient states he would like to proceed with surgery at some point this year. He will call is ready to schedule. We'll have him come back in to  reevaluate him and discuss the surgery again. 2. I did discuss with the patient that he has a higher chance for recurrence secondary to his smoking history, and have asked him to decrease his smoking as much as possible. 3. I discussed with him the signs and symptoms of incarceration and tribulation and the need to proceed to the ER should they occur.        Rosario Jacks., Mileena Rothenberger 01/13/2014, 11:52 AM

## 2014-03-30 ENCOUNTER — Ambulatory Visit: Payer: 59

## 2014-03-30 ENCOUNTER — Other Ambulatory Visit: Payer: 59

## 2014-03-30 DIAGNOSIS — B20 Human immunodeficiency virus [HIV] disease: Secondary | ICD-10-CM

## 2014-03-31 ENCOUNTER — Other Ambulatory Visit: Payer: 59

## 2014-03-31 LAB — CBC WITH DIFFERENTIAL/PLATELET
BASOS ABS: 0 10*3/uL (ref 0.0–0.1)
Basophils Relative: 0 % (ref 0–1)
EOS ABS: 0.4 10*3/uL (ref 0.0–0.7)
Eosinophils Relative: 6 % — ABNORMAL HIGH (ref 0–5)
HCT: 44.6 % (ref 39.0–52.0)
Hemoglobin: 15.9 g/dL (ref 13.0–17.0)
LYMPHS PCT: 40 % (ref 12–46)
Lymphs Abs: 2.9 10*3/uL (ref 0.7–4.0)
MCH: 33.6 pg (ref 26.0–34.0)
MCHC: 35.7 g/dL (ref 30.0–36.0)
MCV: 94.3 fL (ref 78.0–100.0)
Monocytes Absolute: 0.5 10*3/uL (ref 0.1–1.0)
Monocytes Relative: 7 % (ref 3–12)
NEUTROS PCT: 47 % (ref 43–77)
Neutro Abs: 3.4 10*3/uL (ref 1.7–7.7)
PLATELETS: 185 10*3/uL (ref 150–400)
RBC: 4.73 MIL/uL (ref 4.22–5.81)
RDW: 13.9 % (ref 11.5–15.5)
WBC: 7.3 10*3/uL (ref 4.0–10.5)

## 2014-03-31 LAB — COMPLETE METABOLIC PANEL WITH GFR
AST: 14 U/L (ref 0–37)
Albumin: 4 g/dL (ref 3.5–5.2)
Alkaline Phosphatase: 97 U/L (ref 39–117)
BILIRUBIN TOTAL: 0.4 mg/dL (ref 0.2–1.2)
BUN: 7 mg/dL (ref 6–23)
CHLORIDE: 105 meq/L (ref 96–112)
CO2: 26 meq/L (ref 19–32)
Calcium: 9 mg/dL (ref 8.4–10.5)
Creat: 0.96 mg/dL (ref 0.50–1.35)
GFR, Est Non African American: 87 mL/min
Glucose, Bld: 97 mg/dL (ref 70–99)
Potassium: 4 mEq/L (ref 3.5–5.3)
SODIUM: 141 meq/L (ref 135–145)
TOTAL PROTEIN: 6.3 g/dL (ref 6.0–8.3)

## 2014-03-31 LAB — T-HELPER CELL (CD4) - (RCID CLINIC ONLY)
CD4 % Helper T Cell: 28 % — ABNORMAL LOW (ref 33–55)
CD4 T Cell Abs: 840 /uL (ref 400–2700)

## 2014-04-01 LAB — HIV-1 RNA QUANT-NO REFLEX-BLD
HIV 1 RNA Quant: <20 copies/mL (ref <20)
HIV-1 RNA Quant, Log: <1.3 {Log} (ref <1.30)

## 2014-04-13 ENCOUNTER — Encounter: Payer: Self-pay | Admitting: Infectious Diseases

## 2014-04-13 ENCOUNTER — Ambulatory Visit (INDEPENDENT_AMBULATORY_CARE_PROVIDER_SITE_OTHER): Payer: 59 | Admitting: Infectious Diseases

## 2014-04-13 VITALS — BP 119/76 | HR 71 | Temp 97.6°F | Wt 227.5 lb

## 2014-04-13 DIAGNOSIS — Z79899 Other long term (current) drug therapy: Secondary | ICD-10-CM

## 2014-04-13 DIAGNOSIS — C44300 Unspecified malignant neoplasm of skin of unspecified part of face: Secondary | ICD-10-CM

## 2014-04-13 DIAGNOSIS — Z113 Encounter for screening for infections with a predominantly sexual mode of transmission: Secondary | ICD-10-CM

## 2014-04-13 DIAGNOSIS — K4030 Unilateral inguinal hernia, with obstruction, without gangrene, not specified as recurrent: Secondary | ICD-10-CM

## 2014-04-13 DIAGNOSIS — R05 Cough: Secondary | ICD-10-CM

## 2014-04-13 DIAGNOSIS — R059 Cough, unspecified: Secondary | ICD-10-CM

## 2014-04-13 DIAGNOSIS — B20 Human immunodeficiency virus [HIV] disease: Secondary | ICD-10-CM

## 2014-04-13 NOTE — Assessment & Plan Note (Signed)
He is still considering surgery. Will have done beginning of December.

## 2014-04-13 NOTE — Assessment & Plan Note (Signed)
Suspect he has seasonal allergies.  Have asked him to try claritin/ zyrtec/allegra whichever he choses. Will also have him use nasonex for 5 days. He will let us know if he does not feel better in 5 days.

## 2014-04-13 NOTE — Progress Notes (Signed)
   Subjective:    Patient ID: James Terrell, male    DOB: [birth year], 58 y.o.   MRN: [id number]  HPI 58 yo M HIV+ diagnosed in 1997 (1st care in [facility] with AZT then dual therapy with AZT/? (stopped in 2001). 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 Previously started on complera.  Prev eval by CV for atypical CP- pt deffered stress test. Was thought to have esophageal spasm. Has been doing well, having skin cancers removed.  Has been eval for hernia, was referred for surgical eval , he deferred. Having worsening pain due to recent coughing.   Has been having sinus congestion- seen by urgent care at [facility] and was given inhaler, cough syrup, tri-pak and feels no better. Does not want flu shot today. Has chill in late afternoon which goes away with tylenol. No fever. No headaches. Has been taking mucinex.   HIV 1 RNA QUANT (copies/mL)  Date Value  03/30/2014 <20  09/30/2013 <20  04/07/2013 <20   CD4 T CELL ABS (/uL)  Date Value  03/30/2014 840  09/30/2013 810  04/07/2013 630    Review of Systems  Constitutional: Negative for fever and chills.  Respiratory: Positive for cough.   Gastrointestinal: Positive for abdominal pain. Negative for diarrhea and constipation.  Genitourinary: Negative for difficulty urinating.       Objective:   Physical Exam  Constitutional: He appears well-developed and well-nourished.  HENT:  Head:    Mouth/Throat: No oropharyngeal exudate.  Eyes: EOM are normal. Pupils are equal, round, and reactive to light.  Neck: Neck supple.  Cardiovascular: Normal rate, regular rhythm and normal heart sounds.   Pulmonary/Chest: Effort normal and breath sounds normal.  Abdominal: Soft. Bowel sounds are normal. He exhibits no distension. There is no tenderness.  Lymphadenopathy:    He has no cervical adenopathy.          Assessment & Plan:

## 2014-04-13 NOTE — Assessment & Plan Note (Signed)
He is doing well on his ART. Will get flu shot at his local pharmacy and let us know the result. He is offered/refuses condoms. rtc in 6 months.

## 2014-04-13 NOTE — Assessment & Plan Note (Signed)
Has yearly f/u with Derm.

## 2014-04-15 ENCOUNTER — Other Ambulatory Visit: Payer: Self-pay | Admitting: *Deleted

## 2014-04-15 ENCOUNTER — Other Ambulatory Visit: Payer: Self-pay | Admitting: Infectious Diseases

## 2014-04-15 DIAGNOSIS — J0120 Acute ethmoidal sinusitis, unspecified: Secondary | ICD-10-CM

## 2014-04-15 MED ORDER — AZITHROMYCIN 250 MG PO TABS: ORAL_TABLET | ORAL | Status: DC

## 2014-05-01 ENCOUNTER — Ambulatory Visit (INDEPENDENT_AMBULATORY_CARE_PROVIDER_SITE_OTHER): Payer: 59 | Admitting: *Deleted

## 2014-05-01 ENCOUNTER — Ambulatory Visit: Payer: 59

## 2014-05-01 DIAGNOSIS — Z23 Encounter for immunization: Secondary | ICD-10-CM

## 2014-06-15 ENCOUNTER — Other Ambulatory Visit: Payer: Self-pay | Admitting: Infectious Diseases

## 2014-07-06 ENCOUNTER — Ambulatory Visit: Payer: Self-pay

## 2014-07-10 ENCOUNTER — Other Ambulatory Visit: Payer: Self-pay | Admitting: Infectious Diseases

## 2014-07-10 DIAGNOSIS — L719 Rosacea, unspecified: Secondary | ICD-10-CM

## 2014-07-14 MED ORDER — METRONIDAZOLE 0.75 % EX CREA: TOPICAL_CREAM | Freq: Two times a day (BID) | CUTANEOUS | Status: DC

## 2014-07-14 MED ORDER — DESONIDE 0.05 % EX CREA: TOPICAL_CREAM | Freq: Two times a day (BID) | CUTANEOUS | Status: DC

## 2014-08-24 ENCOUNTER — Other Ambulatory Visit: Payer: Self-pay | Admitting: *Deleted

## 2014-08-24 ENCOUNTER — Other Ambulatory Visit: Payer: Self-pay | Admitting: Licensed Clinical Social Worker

## 2014-08-24 DIAGNOSIS — B20 Human immunodeficiency virus [HIV] disease: Secondary | ICD-10-CM

## 2014-08-24 MED ORDER — EMTRICITAB-RILPIVIR-TENOFOV DF 200-25-300 MG PO TABS: ORAL_TABLET | ORAL | Status: DC

## 2014-08-25 ENCOUNTER — Other Ambulatory Visit: Payer: Self-pay | Admitting: Licensed Clinical Social Worker

## 2014-08-25 DIAGNOSIS — B20 Human immunodeficiency virus [HIV] disease: Secondary | ICD-10-CM

## 2014-08-25 MED ORDER — EMTRICITAB-RILPIVIR-TENOFOV DF 200-25-300 MG PO TABS: ORAL_TABLET | ORAL | Status: DC

## 2014-08-31 ENCOUNTER — Other Ambulatory Visit: Payer: Self-pay | Admitting: Licensed Clinical Social Worker

## 2014-08-31 ENCOUNTER — Telehealth: Payer: Self-pay | Admitting: Licensed Clinical Social Worker

## 2014-08-31 DIAGNOSIS — F411 Generalized anxiety disorder: Secondary | ICD-10-CM

## 2014-08-31 MED ORDER — ALPRAZOLAM 0.5 MG PO TABS: 0.2500 mg | ORAL_TABLET | Freq: Two times a day (BID) | ORAL | Status: DC | PRN

## 2014-08-31 NOTE — Telephone Encounter (Signed)
That's ok, thanks!

## 2014-08-31 NOTE — Telephone Encounter (Signed)
Patient lost insurance coverage and would like a refill called in to [place] for xanax 30 instead if the 90 day supply he has been getting because he can afford that. He states that he only takes 1/2 pill daily and is able to make his rx last for a while. I will forward to Dr. Johnnye Sima to make sure it is ok to call in. Please advise

## 2014-09-22 ENCOUNTER — Other Ambulatory Visit: Payer: Self-pay | Admitting: *Deleted

## 2014-09-22 ENCOUNTER — Encounter: Payer: Self-pay | Admitting: Infectious Diseases

## 2014-09-22 DIAGNOSIS — F32A Depression, unspecified: Secondary | ICD-10-CM

## 2014-09-22 DIAGNOSIS — F329 Major depressive disorder, single episode, unspecified: Secondary | ICD-10-CM

## 2014-09-22 DIAGNOSIS — B20 Human immunodeficiency virus [HIV] disease: Secondary | ICD-10-CM

## 2014-09-22 MED ORDER — EMTRICITAB-RILPIVIR-TENOFOV DF 200-25-300 MG PO TABS: ORAL_TABLET | ORAL | Status: DC

## 2014-09-22 MED ORDER — ESCITALOPRAM OXALATE 10 MG PO TABS: 10.0000 mg | ORAL_TABLET | Freq: Every day | ORAL | Status: DC

## 2014-09-25 ENCOUNTER — Telehealth: Payer: Self-pay | Admitting: *Deleted

## 2014-09-25 NOTE — Telephone Encounter (Signed)
Patient called and advised he is sick and has been running a fever for about 10 days. He advised he has had 101 fever with yellow/green mucus from nose and when he coughs. He has not tried anything OTC except ibuprofen for fevers. He advised he is uninsured and can not go to the ED he would like to come and be seen here. I gave him an appt for Tuesday 09/29/14 with Dr Megan Salon. He would like for Dr Johnnye Sima to call him in some antibiotics. Advised the patient will ask the doctor and give him a call back.

## 2014-10-01 ENCOUNTER — Telehealth: Payer: Self-pay | Admitting: *Deleted

## 2014-10-01 NOTE — Telephone Encounter (Signed)
Patient called c/o sinus congestion and extreme fatigue. He overall just does not feel well. He can not see his PCP because he lost his health insurance and can not afford the visit. Gave appt with Dr. Baxter Flattery for 10/06/14 (first available).

## 2014-10-06 ENCOUNTER — Encounter: Payer: Self-pay | Admitting: Internal Medicine

## 2014-10-06 ENCOUNTER — Ambulatory Visit (INDEPENDENT_AMBULATORY_CARE_PROVIDER_SITE_OTHER): Payer: Self-pay | Admitting: Internal Medicine

## 2014-10-06 VITALS — BP 128/81 | HR 75 | Temp 97.7°F | Wt 228.0 lb

## 2014-10-06 DIAGNOSIS — Z79899 Other long term (current) drug therapy: Secondary | ICD-10-CM

## 2014-10-06 DIAGNOSIS — J449 Chronic obstructive pulmonary disease, unspecified: Secondary | ICD-10-CM

## 2014-10-06 DIAGNOSIS — B353 Tinea pedis: Secondary | ICD-10-CM

## 2014-10-06 DIAGNOSIS — B20 Human immunodeficiency virus [HIV] disease: Secondary | ICD-10-CM

## 2014-10-06 DIAGNOSIS — Z113 Encounter for screening for infections with a predominantly sexual mode of transmission: Secondary | ICD-10-CM

## 2014-10-06 LAB — CBC WITH DIFFERENTIAL/PLATELET
Basophils Absolute: 0 10*3/uL (ref 0.0–0.1)
Basophils Relative: 0 % (ref 0–1)
EOS ABS: 0.2 10*3/uL (ref 0.0–0.7)
EOS PCT: 2 % (ref 0–5)
HEMATOCRIT: 46.9 % (ref 39.0–52.0)
HEMOGLOBIN: 16.4 g/dL (ref 13.0–17.0)
LYMPHS ABS: 3.2 10*3/uL (ref 0.7–4.0)
Lymphocytes Relative: 33 % (ref 12–46)
MCH: 33.7 pg (ref 26.0–34.0)
MCHC: 35 g/dL (ref 30.0–36.0)
MCV: 96.3 fL (ref 78.0–100.0)
MONOS PCT: 5 % (ref 3–12)
MPV: 10.1 fL (ref 8.6–12.4)
Monocytes Absolute: 0.5 10*3/uL (ref 0.1–1.0)
Neutro Abs: 5.8 10*3/uL (ref 1.7–7.7)
Neutrophils Relative %: 60 % (ref 43–77)
Platelets: 201 10*3/uL (ref 150–400)
RBC: 4.87 MIL/uL (ref 4.22–5.81)
RDW: 14.5 % (ref 11.5–15.5)
WBC: 9.6 10*3/uL (ref 4.0–10.5)

## 2014-10-06 LAB — COMPLETE METABOLIC PANEL WITH GFR
ALT: 14 U/L (ref 0–53)
AST: 13 U/L (ref 0–37)
Albumin: 3.8 g/dL (ref 3.5–5.2)
Alkaline Phosphatase: 97 U/L (ref 39–117)
BILIRUBIN TOTAL: 0.6 mg/dL (ref 0.2–1.2)
BUN: 8 mg/dL (ref 6–23)
CO2: 24 meq/L (ref 19–32)
Calcium: 9 mg/dL (ref 8.4–10.5)
Chloride: 106 mEq/L (ref 96–112)
Creat: 0.85 mg/dL (ref 0.50–1.35)
GFR, Est Non African American: >89 mL/min
Glucose, Bld: 77 mg/dL (ref 70–99)
Potassium: 4 mEq/L (ref 3.5–5.3)
SODIUM: 142 meq/L (ref 135–145)
TOTAL PROTEIN: 7 g/dL (ref 6.0–8.3)

## 2014-10-06 LAB — LIPID PANEL
CHOL/HDL RATIO: 4.2 ratio
Cholesterol: 118 mg/dL (ref 0–200)
HDL: 28 mg/dL — AB (ref >=40)
LDL CALC: 59 mg/dL (ref 0–99)
TRIGLYCERIDES: 155 mg/dL — AB (ref <150)
VLDL: 31 mg/dL (ref 0–40)

## 2014-10-06 LAB — RPR

## 2014-10-06 MED ORDER — ALBUTEROL SULFATE HFA 108 (90 BASE) MCG/ACT IN AERS: 2.0000 | INHALATION_SPRAY | Freq: Four times a day (QID) | RESPIRATORY_TRACT | Status: DC | PRN

## 2014-10-06 MED ORDER — EMTRICITAB-RILPIVIR-TENOFOV AF 200-25-25 MG PO TABS: 1.0000 | ORAL_TABLET | Freq: Every day | ORAL | Status: DC

## 2014-10-06 MED ORDER — CLOTRIMAZOLE 1 % EX CREA: 1.0000 "application " | TOPICAL_CREAM | Freq: Two times a day (BID) | CUTANEOUS | Status: DC

## 2014-10-06 MED ORDER — IPRATROPIUM-ALBUTEROL 18-103 MCG/ACT IN AERO: 2.0000 | INHALATION_SPRAY | Freq: Four times a day (QID) | RESPIRATORY_TRACT | Status: DC | PRN

## 2014-10-06 NOTE — Progress Notes (Signed)
Patient ID: James Terrell, male   DOB: 03-May-1956, 59 y.o.   MRN: [id number]       Patient ID: James [place], male   DOB: [birth year], 59 y.o.   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   ROS: he reports increasing nasal congestoin/past hx of allergic rhinitis. Taking mucinex to thin out post nasal drainage without improvement  -recent loss of his sister who died of pancreatitis a few months ago  Outpatient Encounter Prescriptions as of 10/06/2014  Medication Sig  . ALPRAZolam (XANAX) 0.5 MG tablet Take 0.5 tablets (0.25 mg total) by mouth 2 (two) times daily as needed.  . Emtricitab-Rilpivir-Tenofov DF (COMPLERA) 200-25-300 MG TABS Take 1 tablet by mouth  daily after supper  . escitalopram (LEXAPRO) 10 MG tablet Take 1 tablet (10 mg total) by mouth daily.  Marland Kitchen azithromycin (ZITHROMAX Z-PAK) 250 MG tablet 2tab on day 1, then 1 tab on days 2-5 (Patient not taking: Reported on 10/06/2014)  . desonide (DESOWEN) 0.05 % cream Apply topically 2 (two) times daily. (Patient not taking: Reported on 10/06/2014)  . metroNIDAZOLE (METROCREAM) 0.75 % cream Apply topically 2 (two) times daily. (Patient not taking: Reported on 10/06/2014)   No facility-administered encounter medications on file as of 10/06/2014.     Patient Active Problem List   Diagnosis Date Noted  . Inguinal hernia 10/15/2013  . Other and unspecified hyperlipidemia 04/21/2013  . Rosacea 04/21/2013  . Excessive cerumen in ear canal 04/01/2012  . CANDIDIASIS, ORAL 02/11/2010  . ACUTE BRONCHITIS 02/11/2010  . NIGHT SWEATS 02/11/2010  . ALLERGIC URTICARIA 07/06/2009  . PANIC DISORDER 03/22/2009  . ACUTE MAXILLARY SINUSITIS 01/11/2009  . Malignant neoplasm of skin of face 01/11/2009  . SKIN LESION 01/05/2009  . TESTOSTERONE DEFICIENCY 10/19/2008  .  BRONCHITIS, CHRONIC 07/17/2008  . ABSCESS, TOOTH 07/17/2008  . 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 EXCESSIVE BELCHING 07/17/2008  . BRONCHIAL PNEUMONIA 02/14/2008  . COUGH 05/10/2007  . DYSPHAGIA, UNSPECIFIED 02/11/2007  . Human immunodeficiency virus (HIV) disease 07/19/2006  . Dermatophytosis of nail 07/19/2006  . ANXIETY 07/19/2006  . SMOKER 07/19/2006  . Pneumothorax 07/19/2006  . SPONTANEOUS PNEUMOTHORAX 07/19/2006     Health Maintenance Due  Topic Date Due  . TETANUS/TDAP  03/31/1975  . COLONOSCOPY  03/30/2006     Review of Systems  Constitutional: Negative for fever, chills, diaphoresis, activity change, appetite change, fatigue and unexpected weight change.  HENT: Negative for congestion, sore throat, rhinorrhea, sneezing, trouble swallowing and sinus pressure.  Eyes: Negative for photophobia and visual disturbance.  Respiratory: Negative for cough, chest tightness, shortness of breath, wheezing and stridor.  Cardiovascular: Negative for chest pain, palpitations and leg swelling.  Gastrointestinal: Negative for nausea, vomiting, abdominal pain, diarrhea, constipation, blood in stool, abdominal distention and anal bleeding.  Genitourinary: Negative for dysuria, hematuria, flank pain and difficulty urinating.  Musculoskeletal: Negative for myalgias, back pain, joint swelling, arthralgias and gait problem.  Skin: Negative for color change, pallor, rash and wound.  Neurological: Negative for dizziness, tremors, weakness and light-headedness.  Hematological: Negative for adenopathy. Does not bruise/bleed easily.  Psychiatric/Behavioral: Negative for behavioral problems, confusion, sleep disturbance, dysphoric mood, decreased concentration and agitation.    Physical Exam   BP 128/81 mmHg  Pulse 75  Temp(Src) 97.7 F (36.5 C) (Oral)  Wt 228  lb (103.42 kg)  SpO2 95% Physical Exam  Constitutional: He is oriented to person, place, and time. He appears  well-developed and well-nourished. No distress.  HENT:  Mouth/Throat: Oropharynx is clear and moist. No oropharyngeal exudate.  Cardiovascular: Normal rate, regular rhythm and normal heart sounds. Exam reveals no gallop and no friction rub.  No murmur heard.  Pulmonary/Chest: Effort normal and breath sounds normal. No respiratory distress. He has no wheezes.  Abdominal: Soft. Bowel sounds are normal. He exhibits no distension. There is no tenderness.  Lymphadenopathy:  He has no cervical adenopathy.  gu = left inguinal hernia, no surrounding erythema,just asymmetric bulge Neurological: He is alert and oriented to person, place, and time.  Skin: fungal infection between toes , great toe nail is hyperkeratotic with onychomycosis Psychiatric: He has a normal mood and affect. His behavior is normal.    Lab Results  Component Value Date   CD4TCELL 28* 03/30/2014   Lab Results  Component Value Date   CD4TABS 840 03/30/2014   CD4TABS 810 09/30/2013   CD4TABS 630 04/07/2013   Lab Results  Component Value Date   HIV1RNAQUANT <20 03/30/2014   Lab Results  Component Value Date   HEPBSAB POS* 09/30/2013   No results found for: RPR  CBC Lab Results  Component Value Date   WBC 7.3 03/30/2014   RBC 4.73 03/30/2014   HGB 15.9 03/30/2014   HCT 44.6 03/30/2014   PLT 185 03/30/2014   MCV 94.3 03/30/2014   MCH 33.6 03/30/2014   MCHC 35.7 03/30/2014   RDW 13.9 03/30/2014   LYMPHSABS 2.9 03/30/2014   MONOABS 0.5 03/30/2014   EOSABS 0.4 03/30/2014   BASOSABS 0.0 03/30/2014   BMET Lab Results  Component Value Date   NA 141 03/30/2014   K 4.0 03/30/2014   CL 105 03/30/2014   CO2 26 03/30/2014   GLUCOSE 97 03/30/2014   BUN 7 03/30/2014   CREATININE 0.96 03/30/2014   CALCIUM 9.0 03/30/2014   GFRNONAA 87 03/30/2014   GFRAA >89 03/30/2014     Assessment and Plan  hiv disease = we will repeat 6 month labs. Consider to change to odefsey, TAF based equivalent of  complera  Allergic rhinitis = can try to do trial of zyrtec or claritin  Copd = smokes 1.5ppd per day x 79yrs, clubbing on physical exam. Suggestive of copd. Unable to get pft or pulm referral until he gets insurance this fall. Will do trial of albuterol  Inguinal hernia, left sided = non-incarcerated. likley to refer to surgery in the Fall once he gets insurance  Onychomycosis of right great toe = will likely need toe nail removal, progressed and not likely to respond to terbinafine. Will refer to podiatry in the fall. No signs of parynochia to need abtx  Tinea pedis = will give rx for antifungal cream

## 2014-10-07 ENCOUNTER — Other Ambulatory Visit: Payer: Self-pay | Admitting: Internal Medicine

## 2014-10-07 DIAGNOSIS — R7989 Other specified abnormal findings of blood chemistry: Secondary | ICD-10-CM

## 2014-10-07 LAB — URINALYSIS, ROUTINE W REFLEX MICROSCOPIC
BILIRUBIN URINE: NEGATIVE
GLUCOSE, UA: NEGATIVE mg/dL
KETONES UR: NEGATIVE mg/dL
Leukocytes, UA: NEGATIVE
Nitrite: NEGATIVE
PH: 6.5 (ref 5.0–8.0)
PROTEIN: NEGATIVE mg/dL
Specific Gravity, Urine: 1.006 (ref 1.005–1.030)
Urobilinogen, UA: 0.2 mg/dL (ref 0.0–1.0)

## 2014-10-07 LAB — URINALYSIS, MICROSCOPIC ONLY
Bacteria, UA: NONE SEEN
CASTS: NONE SEEN
Crystals: NONE SEEN

## 2014-10-07 LAB — URINE CYTOLOGY ANCILLARY ONLY
Chlamydia: NEGATIVE
Neisseria Gonorrhea: NEGATIVE

## 2014-10-07 LAB — T-HELPER CELL (CD4) - (RCID CLINIC ONLY)
CD4 % Helper T Cell: 27 % — ABNORMAL LOW (ref 33–55)
CD4 T Cell Abs: 890 /uL (ref 400–2700)

## 2014-10-08 LAB — TESTOSTERONE, FREE, TOTAL, SHBG
SEX HORMONE BINDING: 36 nmol/L (ref 22–77)
TESTOSTERONE: 189 ng/dL — AB (ref 300–890)
Testosterone, Free: 33.9 pg/mL — ABNORMAL LOW (ref 47.0–244.0)
Testosterone-% Free: 1.8 % (ref 1.6–2.9)

## 2014-10-08 LAB — HIV-1 RNA QUANT-NO REFLEX-BLD: HIV-1 RNA Quant, Log: <1.3 {Log} (ref <1.30)

## 2014-10-27 ENCOUNTER — Other Ambulatory Visit: Payer: Self-pay

## 2014-11-11 ENCOUNTER — Encounter: Payer: Self-pay | Admitting: Infectious Diseases

## 2014-11-11 ENCOUNTER — Ambulatory Visit (INDEPENDENT_AMBULATORY_CARE_PROVIDER_SITE_OTHER): Payer: Self-pay | Admitting: Infectious Diseases

## 2014-11-11 VITALS — BP 117/75 | HR 64 | Temp 98.1°F | Wt 227.0 lb

## 2014-11-11 DIAGNOSIS — Z79899 Other long term (current) drug therapy: Secondary | ICD-10-CM

## 2014-11-11 DIAGNOSIS — C44300 Unspecified malignant neoplasm of skin of unspecified part of face: Secondary | ICD-10-CM

## 2014-11-11 DIAGNOSIS — B20 Human immunodeficiency virus [HIV] disease: Secondary | ICD-10-CM

## 2014-11-11 DIAGNOSIS — F17200 Nicotine dependence, unspecified, uncomplicated: Secondary | ICD-10-CM

## 2014-11-11 DIAGNOSIS — Z720 Tobacco use: Secondary | ICD-10-CM

## 2014-11-11 DIAGNOSIS — Z113 Encounter for screening for infections with a predominantly sexual mode of transmission: Secondary | ICD-10-CM

## 2014-11-11 NOTE — Assessment & Plan Note (Signed)
Encouraged to quit. 

## 2014-11-11 NOTE — Progress Notes (Signed)
   Subjective:    Patient ID: James Terrell, male    DOB: [birth year], 59 y.o.   MRN: [id number]  HPI 59 yo M HIV+ diagnosed in 1997 (1st care in [facility] with AZT then dual therapy with AZT/? (stopped in 2001). 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 Previously started on complera, changed to odefsey 09-2014.  Prev eval by CV for atypical CP- pt deffered stress test. Was thought to have esophageal spasm.  HIV 1 RNA QUANT (copies/mL)  Date Value  10/06/2014 <20  03/30/2014 <20  09/30/2013 <20   CD4 T CELL ABS (/uL)  Date Value  10/06/2014 890  03/30/2014 840  09/30/2013 810   Has not noticed any difference with new art. Makes him go right to sleep.  Working on his garden this summer. "Poorest he has ever been in his life" Sister died in 22-Jul-2022 from pancreatitis, high triglycerides.   Review of Systems See hpi    Objective:   Physical Exam  Constitutional: He appears well-developed and well-nourished.  HENT:  Mouth/Throat: No oropharyngeal exudate.  Eyes: EOM are normal. Pupils are equal, round, and reactive to light.  Neck: Neck supple.  Cardiovascular: Normal rate, regular rhythm and normal heart sounds.   Pulmonary/Chest: Effort normal and breath sounds normal.  Abdominal: Soft. Bowel sounds are normal. There is no tenderness.  Lymphadenopathy:    He has no cervical adenopathy.      Assessment & Plan:

## 2014-11-11 NOTE — Assessment & Plan Note (Signed)
Waiting for health insurance to restart so he can go to derm.

## 2014-11-11 NOTE — Assessment & Plan Note (Signed)
Will wait til he has his f/u in 4-5 months to recheck his labs.  Hep B responded.  Offered/refused condoms.

## 2014-12-02 ENCOUNTER — Ambulatory Visit: Payer: Self-pay

## 2014-12-03 ENCOUNTER — Other Ambulatory Visit: Payer: Self-pay | Admitting: Licensed Clinical Social Worker

## 2014-12-03 DIAGNOSIS — B20 Human immunodeficiency virus [HIV] disease: Secondary | ICD-10-CM

## 2014-12-03 MED ORDER — EMTRICITAB-RILPIVIR-TENOFOV AF 200-25-25 MG PO TABS: 1.0000 | ORAL_TABLET | Freq: Every day | ORAL | Status: DC

## 2014-12-23 ENCOUNTER — Encounter: Payer: Self-pay | Admitting: Infectious Diseases

## 2014-12-23 ENCOUNTER — Other Ambulatory Visit: Payer: Self-pay | Admitting: *Deleted

## 2014-12-23 DIAGNOSIS — F411 Generalized anxiety disorder: Secondary | ICD-10-CM

## 2014-12-23 MED ORDER — ALPRAZOLAM 0.5 MG PO TABS: 0.2500 mg | ORAL_TABLET | Freq: Two times a day (BID) | ORAL | Status: DC | PRN

## 2015-01-28 ENCOUNTER — Other Ambulatory Visit: Payer: Self-pay | Admitting: Infectious Diseases

## 2015-02-26 ENCOUNTER — Other Ambulatory Visit: Payer: Self-pay | Admitting: Infectious Diseases

## 2015-02-26 DIAGNOSIS — F32A Depression, unspecified: Secondary | ICD-10-CM

## 2015-02-26 DIAGNOSIS — F329 Major depressive disorder, single episode, unspecified: Secondary | ICD-10-CM

## 2015-03-22 ENCOUNTER — Telehealth: Payer: Self-pay | Admitting: *Deleted

## 2015-03-22 ENCOUNTER — Encounter: Payer: Self-pay | Admitting: Infectious Diseases

## 2015-03-22 ENCOUNTER — Ambulatory Visit (INDEPENDENT_AMBULATORY_CARE_PROVIDER_SITE_OTHER): Payer: Self-pay | Admitting: Infectious Diseases

## 2015-03-22 VITALS — BP 122/81 | HR 60 | Temp 97.5°F | Ht 75.0 in | Wt 226.0 lb

## 2015-03-22 DIAGNOSIS — K4030 Unilateral inguinal hernia, with obstruction, without gangrene, not specified as recurrent: Secondary | ICD-10-CM

## 2015-03-22 DIAGNOSIS — L989 Disorder of the skin and subcutaneous tissue, unspecified: Secondary | ICD-10-CM

## 2015-03-22 DIAGNOSIS — B20 Human immunodeficiency virus [HIV] disease: Secondary | ICD-10-CM

## 2015-03-22 DIAGNOSIS — Z23 Encounter for immunization: Secondary | ICD-10-CM

## 2015-03-22 LAB — CBC
HCT: 47.9 % (ref 39.0–52.0)
HEMOGLOBIN: 16.9 g/dL (ref 13.0–17.0)
MCH: 34.4 pg — ABNORMAL HIGH (ref 26.0–34.0)
MCHC: 35.3 g/dL (ref 30.0–36.0)
MCV: 97.6 fL (ref 78.0–100.0)
MPV: 9.8 fL (ref 8.6–12.4)
PLATELETS: 163 10*3/uL (ref 150–400)
RBC: 4.91 MIL/uL (ref 4.22–5.81)
RDW: 14 % (ref 11.5–15.5)
WBC: 8.8 10*3/uL (ref 4.0–10.5)

## 2015-03-22 LAB — COMPREHENSIVE METABOLIC PANEL
ALT: 9 U/L (ref 9–46)
AST: 12 U/L (ref 10–35)
Albumin: 3.9 g/dL (ref 3.6–5.1)
Alkaline Phosphatase: 89 U/L (ref 40–115)
BILIRUBIN TOTAL: 0.5 mg/dL (ref 0.2–1.2)
BUN: 10 mg/dL (ref 7–25)
CHLORIDE: 105 mmol/L (ref 98–110)
CO2: 27 mmol/L (ref 20–31)
CREATININE: 0.89 mg/dL (ref 0.70–1.33)
Calcium: 9.3 mg/dL (ref 8.6–10.3)
GLUCOSE: 84 mg/dL (ref 65–99)
POTASSIUM: 4.2 mmol/L (ref 3.5–5.3)
SODIUM: 142 mmol/L (ref 135–146)
TOTAL PROTEIN: 7.2 g/dL (ref 6.1–8.1)

## 2015-03-22 NOTE — Assessment & Plan Note (Addendum)
Have spoken with surgery. His surgery plan is limited by his lack of surgery.  I explained need for him to go to ED with warning signs of incarceration.

## 2015-03-22 NOTE — Assessment & Plan Note (Signed)
Will try to get him into derm when he gets orange card, assistance.

## 2015-03-22 NOTE — Telephone Encounter (Signed)
Dr. Johnnye Sima spoke with surgeon Dr. Georgette Dover regarding painful inguinal hernia.  Patient without insurance, unable to pay copay for uninsured office visit.   Per Dr. Johnnye Sima, waiting for a return call from Arbie Cookey, practice administrator at [facility] to see how they can schedule/help patient with his inguinal hernia.  Patient aware we are waiting on the call back. Landis Gandy, RN

## 2015-03-22 NOTE — Progress Notes (Signed)
   Subjective:    Patient ID: James Terrell, male    DOB: Feb 08, 1956, 59 y.o.   MRN: [id number]  HPI 59 yo M HIV+ diagnosed in 1997 (1st care in [facility] with AZT then dual therapy with AZT/? (stopped in 2001). 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 Previously started on complera, changed to odefsey 09-2014.  Prev eval by CV for atypical CP- pt deffered stress test. Was thought to have esophageal spasm.  Today needs derm f/u for his skin cancers.  Also would like to have surgical f/u (for his inguinal hernia).  Urinary hesitancy with sitting.   HIV 1 RNA QUANT (copies/mL)  Date Value  10/06/2014 <20  03/30/2014 <20  09/30/2013 <20   CD4 T CELL ABS (/uL)  Date Value  10/06/2014 890  03/30/2014 840  09/30/2013 810     Review of Systems  Constitutional: Negative for fever, chills, appetite change and unexpected weight change.  Gastrointestinal: Negative for abdominal pain, diarrhea and constipation.  Genitourinary: Positive for testicular pain. Negative for dysuria, hematuria and difficulty urinating.       Objective:   Physical Exam  Constitutional: He appears well-developed and well-nourished.  HENT:  Mouth/Throat: No oropharyngeal exudate.  Eyes: EOM are normal. Pupils are equal, round, and reactive to light.    Neck: Neck supple.  Cardiovascular: Normal rate and regular rhythm.   Pulmonary/Chest: Effort normal and breath sounds normal.  Abdominal: Soft. Bowel sounds are normal.  Genitourinary:     Musculoskeletal: He exhibits no edema.  Lymphadenopathy:    He has no cervical adenopathy.      Assessment & Plan:

## 2015-03-22 NOTE — Assessment & Plan Note (Signed)
He is doing very well.  Will check his labs while he is here.  He gets flu shot today.  Offered/refused condoms.  Will see him back in 4 months.

## 2015-03-23 NOTE — Telephone Encounter (Signed)
Pt asking about status of referral to surgeon.

## 2015-03-23 NOTE — Telephone Encounter (Signed)
Arbie Cookey, administrator with [facility] returned call regarding referral.  "They are not able to accept patient due to lack of income and insurance. They just cant afford to provide service and suggest we refer patient to one of the [facility] such as [facility] or [facility] that provide indigent care. "  I will forward the information to Dr Johnnye Sima and Sharyn Lull .

## 2015-03-23 NOTE — Telephone Encounter (Signed)
Thanks, can we see about getting him into [facility]?

## 2015-03-24 LAB — HIV-1 RNA QUANT-NO REFLEX-BLD: HIV 1 RNA Quant: <20 copies/mL (ref <20)

## 2015-03-24 NOTE — Telephone Encounter (Signed)
Faxed referral to [facility] for both inguinal hernia and evaluation of recurrent skin cancer.  Pt notified, is waiting on the financial form that [facility] has mailed him. Faxed to [phone].  Phone: [phone]. Landis Gandy, RN

## 2015-03-25 LAB — T-HELPER CELL (CD4) - (RCID CLINIC ONLY)
CD4 % Helper T Cell: 27 % — ABNORMAL LOW (ref 33–55)
CD4 T CELL ABS: 930 /uL (ref 400–2700)

## 2015-04-12 ENCOUNTER — Encounter: Payer: Self-pay | Admitting: Infectious Diseases

## 2015-04-26 ENCOUNTER — Telehealth: Payer: Self-pay | Admitting: *Deleted

## 2015-04-26 DIAGNOSIS — K047 Periapical abscess without sinus: Secondary | ICD-10-CM

## 2015-04-26 MED ORDER — AMOXICILLIN 500 MG PO CAPS: 500.0000 mg | ORAL_CAPSULE | Freq: Three times a day (TID) | ORAL | Status: AC

## 2015-04-26 NOTE — Telephone Encounter (Signed)
MD, please give specifics for amoxicillin rx.

## 2015-04-26 NOTE — Addendum Note (Signed)
Addended by: Aundra Espin C on: 04/26/2015 04:35 PM   Modules accepted: Medications

## 2015-04-26 NOTE — Telephone Encounter (Signed)
Pt believes that the abscess opened this past Friday, Nov. 25.  He stated that he had amoxicillin 500 MG at home and he started taking it Friday.  The patient has taken the last dose of Amoxicillin today.  Wants to continue the Amoxicillin.  The patient is calling [facility] to find out about a Dental Clinic appointment.  MD please advise about Amoxicillin.

## 2015-04-26 NOTE — Addendum Note (Signed)
Addended by: Lorne Skeens D on: 04/26/2015 04:41 PM   Modules accepted: Orders

## 2015-04-26 NOTE — Telephone Encounter (Signed)
Please refill amoxil Dental eval asap thanks

## 2015-04-29 HISTORY — PX: OTHER SURGICAL HISTORY: SHX169

## 2015-05-05 ENCOUNTER — Other Ambulatory Visit: Payer: Self-pay | Admitting: *Deleted

## 2015-05-05 ENCOUNTER — Encounter: Payer: Self-pay | Admitting: Infectious Diseases

## 2015-05-05 DIAGNOSIS — F411 Generalized anxiety disorder: Secondary | ICD-10-CM

## 2015-05-05 MED ORDER — ALPRAZOLAM 0.5 MG PO TABS: 0.2500 mg | ORAL_TABLET | Freq: Two times a day (BID) | ORAL | Status: DC | PRN

## 2015-05-28 ENCOUNTER — Other Ambulatory Visit: Payer: Self-pay | Admitting: Infectious Diseases

## 2015-05-28 DIAGNOSIS — F32A Depression, unspecified: Secondary | ICD-10-CM

## 2015-05-28 DIAGNOSIS — F329 Major depressive disorder, single episode, unspecified: Secondary | ICD-10-CM

## 2015-06-23 ENCOUNTER — Ambulatory Visit: Payer: Self-pay | Admitting: Infectious Diseases

## 2015-06-23 ENCOUNTER — Other Ambulatory Visit (INDEPENDENT_AMBULATORY_CARE_PROVIDER_SITE_OTHER): Payer: Self-pay

## 2015-06-23 DIAGNOSIS — Z79899 Other long term (current) drug therapy: Secondary | ICD-10-CM

## 2015-06-23 DIAGNOSIS — B20 Human immunodeficiency virus [HIV] disease: Secondary | ICD-10-CM

## 2015-06-23 DIAGNOSIS — E291 Testicular hypofunction: Secondary | ICD-10-CM

## 2015-06-23 DIAGNOSIS — R7989 Other specified abnormal findings of blood chemistry: Secondary | ICD-10-CM

## 2015-06-23 DIAGNOSIS — Z113 Encounter for screening for infections with a predominantly sexual mode of transmission: Secondary | ICD-10-CM

## 2015-06-23 LAB — COMPLETE METABOLIC PANEL WITH GFR
ALT: 11 U/L (ref 9–46)
AST: 14 U/L (ref 10–35)
Albumin: 3.8 g/dL (ref 3.6–5.1)
Alkaline Phosphatase: 81 U/L (ref 40–115)
BILIRUBIN TOTAL: 0.5 mg/dL (ref 0.2–1.2)
BUN: 8 mg/dL (ref 7–25)
CALCIUM: 8.9 mg/dL (ref 8.6–10.3)
CHLORIDE: 103 mmol/L (ref 98–110)
CO2: 26 mmol/L (ref 20–31)
CREATININE: 0.91 mg/dL (ref 0.70–1.33)
GFR, Est Non African American: >89 mL/min (ref >=60)
Glucose, Bld: 85 mg/dL (ref 65–99)
Potassium: 3.8 mmol/L (ref 3.5–5.3)
Sodium: 139 mmol/L (ref 135–146)
Total Protein: 6.7 g/dL (ref 6.1–8.1)

## 2015-06-23 LAB — LIPID PANEL
CHOLESTEROL: 141 mg/dL (ref 125–200)
HDL: 26 mg/dL — ABNORMAL LOW (ref >=40)
LDL CALC: 84 mg/dL (ref <130)
TRIGLYCERIDES: 155 mg/dL — AB (ref <150)
Total CHOL/HDL Ratio: 5.4 Ratio — ABNORMAL HIGH (ref <=5.0)
VLDL: 31 mg/dL — AB (ref <30)

## 2015-06-23 LAB — CBC
HEMATOCRIT: 47.4 % (ref 39.0–52.0)
Hemoglobin: 16.6 g/dL (ref 13.0–17.0)
MCH: 34.4 pg — ABNORMAL HIGH (ref 26.0–34.0)
MCHC: 35 g/dL (ref 30.0–36.0)
MCV: 98.1 fL (ref 78.0–100.0)
MPV: 10.3 fL (ref 8.6–12.4)
Platelets: 142 10*3/uL — ABNORMAL LOW (ref 150–400)
RBC: 4.83 MIL/uL (ref 4.22–5.81)
RDW: 14.6 % (ref 11.5–15.5)
WBC: 8.4 10*3/uL (ref 4.0–10.5)

## 2015-06-24 LAB — TESTOSTERONE, % FREE: TESTOSTERONE-% FREE: 1.7 % (ref 1.6–2.9)

## 2015-06-24 LAB — HIV-1 RNA QUANT-NO REFLEX-BLD
HIV 1 RNA QUANT: 67 {copies}/mL — AB (ref <20)
HIV-1 RNA Quant, Log: 1.83 Log copies/mL — ABNORMAL HIGH (ref <1.30)

## 2015-06-24 LAB — URINE CYTOLOGY ANCILLARY ONLY
Chlamydia: NEGATIVE
Neisseria Gonorrhea: NEGATIVE

## 2015-06-24 LAB — TESTOSTERONE: Testosterone: 231 ng/dL — ABNORMAL LOW (ref 250–827)

## 2015-06-24 LAB — TESTOSTERONE, FREE: Testosterone, Free: 40 pg/mL — ABNORMAL LOW (ref 47.0–244.0)

## 2015-06-24 LAB — T-HELPER CELL (CD4) - (RCID CLINIC ONLY)
CD4 T CELL HELPER: 23 % — AB (ref 33–55)
CD4 T Cell Abs: 820 /uL (ref 400–2700)

## 2015-06-24 LAB — RPR

## 2015-06-24 LAB — SEX HORMONE BINDING GLOBULIN: Sex Hormone Binding: 39 nmol/L (ref 22–77)

## 2015-07-07 ENCOUNTER — Encounter: Payer: Self-pay | Admitting: Infectious Diseases

## 2015-07-07 ENCOUNTER — Ambulatory Visit (INDEPENDENT_AMBULATORY_CARE_PROVIDER_SITE_OTHER): Payer: Self-pay | Admitting: Infectious Diseases

## 2015-07-07 VITALS — BP 115/71 | HR 67 | Temp 97.9°F | Wt 231.0 lb

## 2015-07-07 DIAGNOSIS — C44300 Unspecified malignant neoplasm of skin of unspecified part of face: Secondary | ICD-10-CM

## 2015-07-07 DIAGNOSIS — B20 Human immunodeficiency virus [HIV] disease: Secondary | ICD-10-CM

## 2015-07-07 DIAGNOSIS — F17200 Nicotine dependence, unspecified, uncomplicated: Secondary | ICD-10-CM

## 2015-07-07 DIAGNOSIS — K4030 Unilateral inguinal hernia, with obstruction, without gangrene, not specified as recurrent: Secondary | ICD-10-CM

## 2015-07-07 NOTE — Assessment & Plan Note (Signed)
He is doing well. Has a viral "blip" I am not voerly concerned about this. Offered to repeat his VL today.  He wishes to defer Offered/refused condoms.  Has gotten flu shot He defers repeat colonoscopy rtc 4 months Last 2 CMP nl, CBC normal except mild decrease in PLT. Will defer repeating these.

## 2015-07-07 NOTE — Assessment & Plan Note (Signed)
Removed by Derm He will get yearly skin eval

## 2015-07-07 NOTE — Progress Notes (Signed)
   Subjective:    Patient ID: James Terrell, male    DOB: Feb 03, 1956, 60 y.o.   MRN: [id number]  HPI 60 yo M HIV+ diagnosed in 1997 (1st care in [facility] with AZT then dual therapy with AZT/? (stopped in 2001). 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 Previously started on complera, changed to odefsey 09-2014.  Worried that odefsy may have caused his blip.  Prev eval by CV for atypical CP- pt deffered stress test. Was thought to have esophageal spasm. Also hx of skin CA. Resected multiple areas on October 2016.  Also hx of abd wall hernia. Was seen at [facility], was on way to OR when he was defered due to dental infection. It has not been set up again yet. Needs to re-apply for financial assistance. Also needs to quit smoking.   HIV 1 RNA QUANT (copies/mL)  Date Value  06/23/2015 60*  03/22/2015 <20  10/06/2014 <20   CD4 T CELL ABS (/uL)  Date Value  06/23/2015 820  03/22/2015 930  10/06/2014 890   Review of Systems  Constitutional: Negative for fever, chills, appetite change and unexpected weight change.  Gastrointestinal: Negative for diarrhea and constipation.  Genitourinary: Negative for difficulty urinating.       Objective:   Physical Exam  Constitutional: He appears well-developed and well-nourished.  HENT:  Mouth/Throat: No oropharyngeal exudate.  Eyes: EOM are normal. Pupils are equal, round, and reactive to light.  Neck: Neck supple.  Cardiovascular: Normal rate, regular rhythm and normal heart sounds.   Pulmonary/Chest: Effort normal and breath sounds normal.  Abdominal: Soft. Bowel sounds are normal. There is no tenderness. There is no rebound.  Musculoskeletal: He exhibits no edema.  Lymphadenopathy:    He has no cervical adenopathy.      Assessment & Plan:

## 2015-07-07 NOTE — Assessment & Plan Note (Signed)
He will f/u with [facility] to reschedule.

## 2015-07-07 NOTE — Assessment & Plan Note (Signed)
He is encouraged to stop smoking I reminded him that his wounds will heal better with stopping

## 2015-08-23 ENCOUNTER — Encounter: Payer: Self-pay | Admitting: *Deleted

## 2015-09-01 ENCOUNTER — Encounter: Payer: Self-pay | Admitting: Infectious Diseases

## 2015-09-01 ENCOUNTER — Other Ambulatory Visit: Payer: Self-pay | Admitting: *Deleted

## 2015-09-01 DIAGNOSIS — F411 Generalized anxiety disorder: Secondary | ICD-10-CM

## 2015-09-01 MED ORDER — ALPRAZOLAM 0.5 MG PO TABS
0.2500 mg | ORAL_TABLET | Freq: Two times a day (BID) | ORAL | Status: DC | PRN
Start: 2015-09-01 — End: 2016-01-12

## 2015-09-22 ENCOUNTER — Other Ambulatory Visit: Payer: Self-pay

## 2015-09-29 ENCOUNTER — Other Ambulatory Visit: Payer: Self-pay | Admitting: Internal Medicine

## 2015-10-04 ENCOUNTER — Other Ambulatory Visit: Payer: Self-pay

## 2015-10-04 ENCOUNTER — Encounter: Payer: Self-pay | Admitting: Infectious Diseases

## 2015-10-04 NOTE — Telephone Encounter (Signed)
Please see the patient's email request regarding follow up. Patient scheduled 5/8 for lab work, requests to follow up on MyChart only - not with physician - due to difficulty getting through to reschedule his appointment with Dr. Johnnye Sima. Please advise on followup after lab work. James Gandy, RN ____________ James Terrell I have an appointment scheduled for 10/07/15. I was unable to make it to get the lab work done. I need to cancel the appointment. I have tried for 1 week to get in touch with the office with no success. It is impossible to get an answer. I was on hold for a very long time each time I tried. I will come by and have my blood work done. View the results online and then send a message back to Dr. Johnnye Sima to see if he thinks I need to come in.  Please cancel my appointment.

## 2015-10-06 ENCOUNTER — Ambulatory Visit: Payer: Self-pay | Admitting: Infectious Diseases

## 2015-10-11 ENCOUNTER — Other Ambulatory Visit: Payer: Self-pay

## 2015-10-12 ENCOUNTER — Other Ambulatory Visit (INDEPENDENT_AMBULATORY_CARE_PROVIDER_SITE_OTHER): Payer: Self-pay

## 2015-10-12 DIAGNOSIS — B20 Human immunodeficiency virus [HIV] disease: Secondary | ICD-10-CM

## 2015-10-13 LAB — HIV-1 RNA QUANT-NO REFLEX-BLD: HIV-1 RNA Quant, Log: <1.3 Log copies/mL (ref <1.30)

## 2015-10-13 LAB — T-HELPER CELL (CD4) - (RCID CLINIC ONLY)
CD4 T CELL ABS: 810 /uL (ref 400–2700)
CD4 T CELL HELPER: 27 % — AB (ref 33–55)

## 2015-11-01 ENCOUNTER — Encounter: Payer: Self-pay | Admitting: Infectious Diseases

## 2015-11-01 ENCOUNTER — Ambulatory Visit: Payer: Self-pay | Admitting: *Deleted

## 2015-11-01 ENCOUNTER — Telehealth: Payer: Self-pay

## 2015-11-01 ENCOUNTER — Ambulatory Visit (INDEPENDENT_AMBULATORY_CARE_PROVIDER_SITE_OTHER): Payer: Self-pay | Admitting: Infectious Diseases

## 2015-11-01 VITALS — BP 104/70 | HR 68 | Temp 98.2°F | Wt 221.0 lb

## 2015-11-01 DIAGNOSIS — H3530 Unspecified macular degeneration: Secondary | ICD-10-CM

## 2015-11-01 DIAGNOSIS — K4091 Unilateral inguinal hernia, without obstruction or gangrene, recurrent: Secondary | ICD-10-CM

## 2015-11-01 DIAGNOSIS — B20 Human immunodeficiency virus [HIV] disease: Secondary | ICD-10-CM

## 2015-11-01 DIAGNOSIS — F411 Generalized anxiety disorder: Secondary | ICD-10-CM

## 2015-11-01 DIAGNOSIS — F17200 Nicotine dependence, unspecified, uncomplicated: Secondary | ICD-10-CM

## 2015-11-01 DIAGNOSIS — E785 Hyperlipidemia, unspecified: Secondary | ICD-10-CM

## 2015-11-01 NOTE — Assessment & Plan Note (Signed)
He is aware of emergency symptoms.  He will f/u with his MD at [facility].  I offered to have him seen at [facility]

## 2015-11-01 NOTE — Assessment & Plan Note (Signed)
He is doing well.  I discussed his labs with him.  Will see him back in 6 months.

## 2015-11-01 NOTE — Assessment & Plan Note (Signed)
He is doing well I encouraged him to stop.  He does not want patches or wellbutrin.

## 2015-11-01 NOTE — Assessment & Plan Note (Signed)
Well controlled off meds 

## 2015-11-01 NOTE — Telephone Encounter (Signed)
Called patient to verify if he has insurance, in order to proceed with othalmology referral ordered by Dr. Johnnye Sima. Left message for patient to call back. Rodman Key, LPN

## 2015-11-01 NOTE — Progress Notes (Signed)
   Subjective:    Patient ID: James Terrell, male    DOB: 06/04/1955, 60 y.o.   MRN: [id number]  HPI 60 yo M HIV+ diagnosed in 1997 (1st care in [facility] with AZT then dual therapy with AZT/? (stopped in 2001). 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 Previously started on complera, changed to odefsey 09-2014.  Prev eval by CV for atypical CP- pt deffered stress test. Was thought to have esophageal spasm. Also hx of skin CA. Resected multiple areas on October 2016. He continues to have f/u.  Also hx of abd wall hernia. Was seen at [facility], being deferred til he quits smoking.  Had floaters in his eyes, had recent opto eval. Told he has first stages of macular degeneration. Is going to get second opinion.   HIV 1 RNA QUANT (copies/mL)  Date Value  10/12/2015 <20  06/23/2015 67*  03/22/2015 <20   CD4 T CELL ABS (/uL)  Date Value  10/12/2015 810  06/23/2015 820  03/22/2015 930     Review of Systems  Constitutional: Negative for appetite change and unexpected weight change.  Eyes: Positive for visual disturbance.  Gastrointestinal: Negative for diarrhea and constipation.  Genitourinary: Negative for difficulty urinating.  lost 10# since Feb. Walking a lot at work.      Objective:   Physical Exam  Constitutional: He appears well-developed and well-nourished.  HENT:  Mouth/Throat: No oropharyngeal exudate.  Eyes: EOM are normal. Pupils are equal, round, and reactive to light.  Neck: Neck supple.  Cardiovascular: Normal rate, regular rhythm and normal heart sounds.   Pulmonary/Chest: Effort normal and breath sounds normal.  Abdominal: Soft. Bowel sounds are normal. There is no tenderness. There is no rebound.  Genitourinary:     Lymphadenopathy:    He has no cervical adenopathy.      Assessment & Plan:

## 2015-11-01 NOTE — Assessment & Plan Note (Signed)
Will have him seen by ophtho 

## 2015-11-01 NOTE — BH Specialist Note (Signed)
Counselor met with Alias today in the exam room for a warm handoff.  Patient was oriented times four with good affect and dress.  Patient was alert and somewhat talkative.  Counselor made the necessary introductions and provided a business card.  Patient indicated that he was doing fine and that his Zanax was helping him to get through most days.  Counselor provided support and encouragement.  Counselor spoke with patient about becoming addicted to Zanax and to supplement it with talking to someone and learning better coping skills. Patient stated that he would make an appointment if the need arose.    Rolena Infante, MA, LPC [facility]

## 2015-11-02 NOTE — Telephone Encounter (Signed)
Pt prefers to be referred to [facility].  He works for [employer] and receives a "better deal" on MD appointments there.  The patient stated that he is only available by phone before 12:30 PM.  He works from 1-9 PM.  RN will begin the process of obtaining the appointment for the patient.  Appointment made for Thurs., June 29 at 8 AM, [place], [place].  Arrive 15 minutes early for registration, bring photo ID, Insurance card and specialist co-pay.  Spoke with the pt and gave him all of this information.  Faxed records to [facility].  Let them know that we are on EPIC.

## 2015-11-08 ENCOUNTER — Telehealth: Payer: Self-pay | Admitting: *Deleted

## 2015-11-08 ENCOUNTER — Encounter: Payer: Self-pay | Admitting: *Deleted

## 2015-11-08 DIAGNOSIS — K4090 Unilateral inguinal hernia, without obstruction or gangrene, not specified as recurrent: Secondary | ICD-10-CM

## 2015-11-08 NOTE — Addendum Note (Signed)
Addended by: Lorne Skeens D on: 11/08/2015 02:13 PM   Modules accepted: Orders

## 2015-11-08 NOTE — Telephone Encounter (Signed)
Referal to [facility], New Pt Coordinator, Rory Percy.  Left message with Santiago Glad to call the pt with the appt and let [facility] know about faxing patient records.  Left patient phone number and [facility] Triage number to return call.  Sent the pt a MyChart message with this information.

## 2015-11-08 NOTE — Telephone Encounter (Signed)
Requesting referral to general surgeon for L inguina hernia - intermittent severe pain, especially when lifting at work.  Dr. Johnnye Sima please advise.

## 2015-11-08 NOTE — Telephone Encounter (Signed)
Please refer pt to general surgery for hernia repair.

## 2015-12-01 ENCOUNTER — Other Ambulatory Visit: Payer: Self-pay | Admitting: Infectious Diseases

## 2016-01-11 ENCOUNTER — Encounter: Payer: Self-pay | Admitting: Infectious Diseases

## 2016-01-12 ENCOUNTER — Other Ambulatory Visit: Payer: Self-pay

## 2016-01-12 DIAGNOSIS — F411 Generalized anxiety disorder: Secondary | ICD-10-CM

## 2016-01-12 MED ORDER — ALPRAZOLAM 0.5 MG PO TABS: 0.2500 mg | ORAL_TABLET | Freq: Two times a day (BID) | ORAL | 0 refills | Status: DC | PRN

## 2016-02-26 ENCOUNTER — Encounter (HOSPITAL_COMMUNITY): Payer: Self-pay | Admitting: Emergency Medicine

## 2016-02-26 ENCOUNTER — Emergency Department (HOSPITAL_COMMUNITY)
Admission: EM | Admit: 2016-02-26 | Discharge: 2016-02-26 | Disposition: A | Discharge status: Home / Self Care | Payer: PRIVATE HEALTH INSURANCE | Attending: Emergency Medicine | Admitting: Emergency Medicine

## 2016-02-26 DIAGNOSIS — F17210 Nicotine dependence, cigarettes, uncomplicated: Secondary | ICD-10-CM | POA: Insufficient documentation

## 2016-02-26 DIAGNOSIS — Z79899 Other long term (current) drug therapy: Secondary | ICD-10-CM | POA: Insufficient documentation

## 2016-02-26 DIAGNOSIS — R112 Nausea with vomiting, unspecified: Secondary | ICD-10-CM | POA: Diagnosis present

## 2016-02-26 DIAGNOSIS — Z21 Asymptomatic human immunodeficiency virus [HIV] infection status: Secondary | ICD-10-CM | POA: Insufficient documentation

## 2016-02-26 DIAGNOSIS — K4030 Unilateral inguinal hernia, with obstruction, without gangrene, not specified as recurrent: Secondary | ICD-10-CM | POA: Insufficient documentation

## 2016-02-26 LAB — LIPASE, BLOOD: LIPASE: 23 U/L (ref 11–51)

## 2016-02-26 LAB — COMPREHENSIVE METABOLIC PANEL
ALT: 14 U/L — ABNORMAL LOW (ref 17–63)
ANION GAP: 8 (ref 5–15)
AST: 20 U/L (ref 15–41)
Albumin: 4.3 g/dL (ref 3.5–5.0)
Alkaline Phosphatase: 85 U/L (ref 38–126)
BILIRUBIN TOTAL: 1.1 mg/dL (ref 0.3–1.2)
BUN: 11 mg/dL (ref 6–20)
CALCIUM: 9.5 mg/dL (ref 8.9–10.3)
CO2: 27 mmol/L (ref 22–32)
Chloride: 102 mmol/L (ref 101–111)
Creatinine, Ser: 0.95 mg/dL (ref 0.61–1.24)
GLUCOSE: 135 mg/dL — AB (ref 65–99)
POTASSIUM: 4 mmol/L (ref 3.5–5.1)
Sodium: 137 mmol/L (ref 135–145)
Total Protein: 8.1 g/dL (ref 6.5–8.1)

## 2016-02-26 LAB — CBC
HEMATOCRIT: 51.4 % (ref 39.0–52.0)
HEMOGLOBIN: 18.5 g/dL — AB (ref 13.0–17.0)
MCH: 34.2 pg — AB (ref 26.0–34.0)
MCHC: 36 g/dL (ref 30.0–36.0)
MCV: 95 fL (ref 78.0–100.0)
Platelets: 162 10*3/uL (ref 150–400)
RBC: 5.41 MIL/uL (ref 4.22–5.81)
RDW: 13.9 % (ref 11.5–15.5)
WBC: 13.2 10*3/uL — ABNORMAL HIGH (ref 4.0–10.5)

## 2016-02-26 MED ORDER — ONDANSETRON HCL 4 MG/2ML IJ SOLN
4.0000 mg | Freq: Once | INTRAMUSCULAR | Status: AC | PRN
  Administered 2016-02-26: 4 mg via INTRAVENOUS
  Filled 2016-02-26: qty 2

## 2016-02-26 MED ORDER — SODIUM CHLORIDE 0.9 % IV BOLUS (SEPSIS)
1000.0000 mL | Freq: Once | INTRAVENOUS | Status: AC
  Administered 2016-02-26: 1000 mL via INTRAVENOUS

## 2016-02-26 NOTE — ED Notes (Signed)
Urinal at bedside, patient aware we need urine 

## 2016-02-26 NOTE — ED Provider Notes (Signed)
[facility] DEPT Provider Note   CSN: [id number] Arrival date & time: 02/26/16  0503     History   Chief Complaint Chief Complaint  Patient presents with  . Emesis    HPI James Terrell is a 60 y.o. male.  HPI Patient has a known inguinal hernia. Starting yesterday morning began to have more pain. States he's been scheduled to have the hernia fixed twice but did not get done. States it is always out. States it became more painful and now had nausea and some vomiting. No fevers. States he has not been passing gas. States he was told by a surgeon come in if he ever started vomiting and not passing gas. States he's been told in the past that they would not operate on him if he was still smoking.   Past Medical History:  Diagnosis Date  . Anxiety   . Bronchitis, chronic ([place])   . HIV (human immunodeficiency virus infection) ([place]) in 1997   Primary did diagnosis, 1st care in [place] with AZT then dual therapy with retrovir and another stopped in 2001 and has had stable levels  . Onychomycosis    tonnails  . Pneumonia   . Smoker   . Spontaneous pneumothorax     Patient Active Problem List   Diagnosis Date Noted  . Macular degeneration 11/01/2015  . Inguinal hernia 10/15/2013  . Hyperlipidemia 04/21/2013  . Rosacea 04/21/2013  . CANDIDIASIS, ORAL 02/11/2010  . ACUTE BRONCHITIS 02/11/2010  . NIGHT SWEATS 02/11/2010  . ALLERGIC URTICARIA 07/06/2009  . PANIC DISORDER 03/22/2009  . Malignant neoplasm of skin of face 01/11/2009  . Disorder of skin or subcutaneous tissue 01/05/2009  . TESTOSTERONE DEFICIENCY 10/19/2008  . BRONCHITIS, CHRONIC 07/17/2008  . 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 EXCESSIVE BELCHING 07/17/2008  . BRONCHIAL PNEUMONIA 02/14/2008  . DYSPHAGIA, UNSPECIFIED 02/11/2007  . Human immunodeficiency virus (HIV) disease ([place]) 07/19/2006  . Dermatophytosis of nail 07/19/2006  . ANXIETY 07/19/2006  . SMOKER 07/19/2006  . Pneumothorax  07/19/2006  . SPONTANEOUS PNEUMOTHORAX 07/19/2006    Past Surgical History:  Procedure Laterality Date  . CHEST TUBE INSERTION         Home Medications    Prior to Admission medications   Medication Sig Start Date End Date Taking? Authorizing Provider  ALPRAZolam Duanne Moron) 0.5 MG tablet Take 0.5 tablets (0.25 mg total) by mouth 2 (two) times daily as needed. 01/12/16   Campbell Riches, MD  emtricitabine-rilpivir-tenofovir AF (ODEFSEY) 200-25-25 MG TABS per tablet Take 1 tablet by mouth daily. With food 12/03/14   Truman Hayward, MD  escitalopram (LEXAPRO) 10 MG tablet TAKE 1 TABLET BY MOUTH DAILY 12/01/15   Campbell Riches, MD  ODEFSEY 200-25-25 MG TABS tablet TAKE 1 TABLET BY MOUTH DAILY WITH FOOD 09/29/15   Campbell Riches, MD    Family History Family History  Problem Relation Age of Onset  . Emphysema Father   . COPD Father   . Skin cancer Father   . Heart disease Paternal Aunt   . Heart disease Paternal Uncle   . Heart disease Paternal Grandfather   . Pancreatitis Sister   . Hyperlipidemia Sister   . Macular degeneration Sister   . Blindness Maternal Grandmother     Social History Social History  Substance Use Topics  . Smoking status: Current Every Day Smoker    Packs/day: 1.50    Years: 39.00    Types: Cigarettes  . Smokeless tobacco: Never Used  .  Alcohol use No     Allergies   Review of patient's allergies indicates no known allergies.   Review of Systems Review of Systems  Constitutional: Negative for appetite change.  Respiratory: Negative for shortness of breath.   Cardiovascular: Negative for chest pain.  Gastrointestinal: Positive for abdominal distention, abdominal pain, nausea and vomiting.  Genitourinary: Negative for flank pain.  Musculoskeletal: Negative for back pain.  Neurological: Negative for seizures and weakness.  Psychiatric/Behavioral: Negative for confusion.     Physical Exam Updated Vital Signs BP 134/80   Pulse 71    Temp 97.8 F (36.6 C) (Oral)   Resp 18   Ht 6\' 3"  (1.905 m)   Wt 225 lb (102.1 kg)   SpO2 93%   BMI 28.12 kg/m   Physical Exam  Constitutional: He appears well-developed.  HENT:  Head: Atraumatic.  Eyes: EOM are normal.  Neck: Neck supple.  Cardiovascular: Normal rate.   Pulmonary/Chest: Effort normal.  Abdominal: He exhibits distension. There is tenderness. A hernia is present.  Large left inguinal hernia that goes down to the left scrotum, which is enlarged. Initial inguinal area and scrotum were very firm. With some constant dry pressure that was a reduction of the hernia although there was still swelling in the scrotum it was not nearly as firm as before. Patient felt much better. Abdomen is distended mildly diffusely tender.  Genitourinary: Penis normal.  Musculoskeletal: He exhibits no edema.  Neurological: He is alert.  Skin: Skin is warm.     ED Treatments / Results  Labs (all labs ordered are listed, but only abnormal results are displayed) Labs Reviewed  COMPREHENSIVE METABOLIC PANEL - Abnormal; Notable for the following:       Result Value   Glucose, Bld 135 (*)    ALT 14 (*)    All other components within normal limits  CBC - Abnormal; Notable for the following:    WBC 13.2 (*)    Hemoglobin 18.5 (*)    MCH 34.2 (*)    All other components within normal limits  LIPASE, BLOOD  URINALYSIS, ROUTINE W REFLEX MICROSCOPIC (NOT AT [facility])    EKG  EKG Interpretation None       Radiology No results found.  Procedures Procedures (including critical care time)  Medications Ordered in ED Medications  sodium chloride 0.9 % bolus 1,000 mL (1,000 mLs Intravenous New Bag/Given 02/26/16 0709)  ondansetron (ZOFRAN) injection 4 mg (4 mg Intravenous Given 02/26/16 0620)     Initial Impression / Assessment and Plan / ED Course  I have reviewed the triage vital signs and the nursing notes.  Pertinent labs & imaging results that were available during my care of the  patient were reviewed by me and considered in my medical decision making (see chart for details).  Clinical Course    Patient with inguinal hernia. Reduced. Feels much better. Abdominal pain has improved and is less distention. Instructed on self reduction of the hernia. Will discharge to follow-up with general surgery.  Final Clinical Impressions(s) / ED Diagnoses   Final diagnoses:  Unilateral inguinal hernia with obstruction and without gangrene, recurrence not specified    New Prescriptions New Prescriptions   No medications on file     Davonna Belling, MD 02/26/16 [phone]

## 2016-02-26 NOTE — ED Triage Notes (Signed)
Pt reports to ER c/o abdominal pain associated with existent hernia; pain started yesterday morning and worsened throughout day; pt began vomiting last night; states vomiting helps improve his pain; reports he has not been passing gas; last BM on 9/28

## 2016-02-29 ENCOUNTER — Telehealth: Payer: Self-pay | Admitting: Infectious Diseases

## 2016-02-29 ENCOUNTER — Other Ambulatory Visit: Payer: Self-pay | Admitting: *Deleted

## 2016-02-29 DIAGNOSIS — B20 Human immunodeficiency virus [HIV] disease: Secondary | ICD-10-CM

## 2016-02-29 MED ORDER — ESCITALOPRAM OXALATE 10 MG PO TABS: 10.0000 mg | ORAL_TABLET | Freq: Every day | ORAL | 4 refills | Status: AC

## 2016-02-29 MED ORDER — EMTRICITAB-RILPIVIR-TENOFOV AF 200-25-25 MG PO TABS: 1.0000 | ORAL_TABLET | Freq: Every day | ORAL | 5 refills | Status: AC

## 2016-02-29 NOTE — Telephone Encounter (Signed)
Patient now had [employer] and does not qualify for adap.  He is wanting his prescriptions for [facility] and Lexapro sent to [facility] in [place].

## 2016-02-29 NOTE — Telephone Encounter (Signed)
Rx sent 

## 2016-03-01 ENCOUNTER — Telehealth: Payer: Self-pay | Admitting: *Deleted

## 2016-03-01 ENCOUNTER — Encounter: Payer: Self-pay | Admitting: *Deleted

## 2016-03-01 NOTE — Telephone Encounter (Signed)
Thanks travis. This is ok with me.  I couldn't find a note that didn't have [facility] on it.  thanks

## 2016-03-01 NOTE — Telephone Encounter (Signed)
Patient called to advise that since Monday 02/28/16 through today he has been home sick with a bad cold. He works at [employer] and his Freight forwarder advised he will need a note to come back to work. He asked that Dr Johnnye Sima provide him a note for being out 02/28/16-03/01/16 as he anticipates going back to work tomorrow 03/02/16. He asked if the not could be sent to his email address and not contain any infectious disease name or information. Advised will ask the doctor and give him a call back either way.   email- [email or web address]

## 2016-03-01 NOTE — Telephone Encounter (Signed)
Called the patient and advised note sent to his email and does not have any ID information advised to call the office if he needs anything else.

## 2016-03-15 ENCOUNTER — Other Ambulatory Visit: Payer: Self-pay | Admitting: Infectious Diseases

## 2016-03-15 ENCOUNTER — Encounter: Payer: Self-pay | Admitting: Infectious Diseases

## 2016-03-15 DIAGNOSIS — F411 Generalized anxiety disorder: Secondary | ICD-10-CM

## 2016-03-15 MED ORDER — ALPRAZOLAM 0.5 MG PO TABS: 0.2500 mg | ORAL_TABLET | Freq: Two times a day (BID) | ORAL | 1 refills | Status: AC | PRN

## 2016-04-12 ENCOUNTER — Encounter: Payer: Self-pay | Admitting: Infectious Diseases

## 2016-04-19 ENCOUNTER — Other Ambulatory Visit: Payer: Self-pay

## 2016-05-03 ENCOUNTER — Ambulatory Visit: Payer: Self-pay | Admitting: Infectious Diseases

## 2016-11-27 ENCOUNTER — Other Ambulatory Visit: Payer: Self-pay | Admitting: Infectious Diseases

## 2016-11-29 ENCOUNTER — Other Ambulatory Visit: Payer: Self-pay | Admitting: Infectious Diseases

## 2019-10-31 ENCOUNTER — Other Ambulatory Visit: Payer: Self-pay | Admitting: Surgery

## 2020-03-10 NOTE — H&P (Signed)
HPI:   James Terrell is a 64 y.o. male who presents as a new Patient.   Referring Provider: Pcp, Patient Does Not H*  Chief complaint: Tongue-tie.  HPI: Congenital tongue-tie since birth. He had speech therapy as a child. He is having trouble as an adult, he is finding it very painful to kiss and to have other intermittent moments with his lover. He has an upcoming hernia surgery in August. He is inquiring about possibly having the tongue-tie released. He is HIV positive but well controlled with undetectable virus loads.  PMH/Meds/All/SocHx/FamHx/ROS:   Past Medical History:  Diagnosis Date  . Basal cell carcinoma 03/2015  right mid chest  . HIV (human immunodeficiency virus infection) ([place])  . 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 Panic disorder 03/22/2009  Overview: Qualifier: Diagnosis of By: Arnoldo Morale MD, [place] 04/21/2013  Last Assessment & Plan: Will refill his topicals.  . Seborrheic dermatitis  . Spontaneous tension pneumothorax 07/19/2006  Overview: Qualifier: Diagnosis of By: Quentin Cornwall MD, Percell Miller  . Testosterone deficiency 10/19/2008  Overview: Qualifier: Diagnosis of By: Arnoldo Morale MD, Balinda Quails Last Assessment & Plan: Has difficulty with maintaining erection. Would suggest this and his sweats are related to his testosterone deficiency.  . Varicella   Past Surgical History:  Procedure Laterality Date  . SKIN BIOPSY   No family history of bleeding disorders, wound healing problems or difficulty with anesthesia.   Social History   Socioeconomic History  . Marital status: Single  Spouse name: Not on file  . Number of children: Not on file  . Years of education: Not on file  . Highest education level: Not on file  Occupational History  . Not on file  Tobacco Use  . Smoking status: Current Every Day Smoker   Packs/day: 0.50  Types: Cigarettes  . Smokeless tobacco: Never Used  Vaping Use  . Vaping Use: Never used  Substance and Sexual Activity  . Alcohol use: Not Currently  Comment: occasionally a glass of wine  . Drug use: Yes  Types: Marijuana  . Sexual activity: Not on file  Other Topics Concern  . Not on file  Social History Narrative  . Not on file   Social Determinants of Health   Financial Resource Strain:  . Difficulty of Paying Living Expenses:  Food Insecurity:  . Worried About Charity fundraiser in the Last Year:  . Arboriculturist in the Last Year:  Transportation Needs:  . Film/video editor (Medical):  Marland Kitchen Lack of Transportation (Non-Medical):  Physical Activity:  . Days of Exercise per Week:  . Minutes of Exercise per Session:  Stress:  . Feeling of Stress :  Social Connections:  . Frequency of Communication with Friends and Family:  . Frequency of Social Gatherings with Friends and Family:  . Attends Religious Services:  . Active Member of Clubs or Organizations:  . Attends Archivist Meetings:  Marland Kitchen Marital Status:   Current Outpatient Medications:  . acetaminophen (TYLENOL) 500 MG tablet, Take 500 mg by mouth as needed., Disp: , Rfl:  . atorvastatin (LIPITOR) 20 MG tablet, TAKE 1 TABLET BY MOUTH DAILY., Disp: 30 tablet, Rfl: 11 . cyanocobalamin (VITAMIN B-12) 1000 MCG tablet, Take 1 tablet (1,000 mcg total) by mouth daily., Disp: 30 tablet, Rfl: 5 . cyanocobalamin (VITAMIN B12) 500 MCG  tablet, TAKE 2 TABLETS BY MOUTH DAILY., Disp: 130 tablet, Rfl: 3 . escitalopram oxalate (LEXAPRO) 10 MG tablet, TAKE 1 TABLET BY MOUTH DAILY., Disp: 90 tablet, Rfl: 5 . fluorouracil (EFUDEX) 5 % cream, Apply to the spot on the left shoulder daily for 4-6 weeks, Disp: 40 g, Rfl: 3 . folic acid (FOLVITE) 1 MG tablet, TAKE 2 TABLETS BY MOUTH DAILY., Disp: 30 tablet, Rfl: 5 . ODEFSEY 200-25-25 mg per tablet, TAKE 1 TABLET BY MOUTH DAILY, Disp: 30 tablet, Rfl: 11 .  albuterol 90 mcg/actuation inhaler, Inhale 2 puffs into the lungs every 4 (four) hours as needed for up to 30 days., Disp: 1 Inhaler, Rfl: 0 . budesonide-formoteroL (SYMBICORT) 160-4.5 mcg/actuation inhaler, Inhale 2 puffs into the lungs 2 times daily for 30 days., Disp: 1 Inhaler, Rfl: 1  A complete ROS was performed with pertinent positives/negatives noted in the HPI. The remainder of the ROS are negative.   Physical Exam:   BP 126/84  Pulse 73  Temp 97.1 F (36.2 C)  Ht 1.905 m (6\' 3" )  Wt 104.8 kg (231 lb)  BMI 28.87 kg/m   General: Healthy and alert, in no distress, breathing easily. Normal affect. In a pleasant mood. Head: Normocephalic, atraumatic. No masses, or scars. Eyes: Pupils are equal, and reactive to light. Vision is grossly intact. No spontaneous or gaze nystagmus. Ears: Ear canals are clear. Tympanic membranes are intact, with normal landmarks and the middle ears are clear and healthy. Hearing: Grossly normal. Nose: Nasal cavities are clear with healthy mucosa, no polyps or exudate. Airways are patent. Face: No masses or scars, facial nerve function is symmetric. Oral Cavity: No mucosal abnormalities are noted. Tongue with restricted mobility secondary to a significant and broadly based ankyloglossia. Upper denture and lower partial in place. Oropharynx: Tonsils are symmetric. There are no mucosal masses identified. Tongue base appears normal and healthy. Larynx/Hypopharynx: deferred Chest: Deferred Neck: No palpable masses, no cervical adenopathy, no thyroid nodules or enlargement. Neuro: Cranial nerves II-XII with normal function. Balance: Normal gate. Other findings: none.  Independent Review of Additional Tests or Records:  none  Procedures:  none  Impression & Plans:  Congenital tongue-tie causing symptomatic problems. Recommend consideration for frenuloplasty which can be performed while under anesthesia without any additional anesthesia time, during his  upcoming hernia surgery. I will double check with his general surgeon to make sure that is okay. All questions were answered.

## 2020-03-15 ENCOUNTER — Other Ambulatory Visit (HOSPITAL_COMMUNITY): Payer: PRIVATE HEALTH INSURANCE

## 2020-03-18 ENCOUNTER — Ambulatory Visit (HOSPITAL_BASED_OUTPATIENT_CLINIC_OR_DEPARTMENT_OTHER): Admit: 2020-03-18 | Payer: PRIVATE HEALTH INSURANCE | Admitting: Surgery

## 2020-03-18 ENCOUNTER — Encounter (HOSPITAL_BASED_OUTPATIENT_CLINIC_OR_DEPARTMENT_OTHER): Payer: Self-pay

## 2020-03-18 SURGERY — REPAIR, HERNIA, INGUINAL, ADULT
Anesthesia: General

## 2020-03-31 ENCOUNTER — Other Ambulatory Visit: Payer: Self-pay | Admitting: Internal Medicine

## 2020-03-31 ENCOUNTER — Other Ambulatory Visit: Payer: PRIVATE HEALTH INSURANCE

## 2020-03-31 DIAGNOSIS — Z20822 Contact with and (suspected) exposure to covid-19: Secondary | ICD-10-CM

## 2020-04-02 LAB — SPECIMEN STATUS REPORT

## 2020-04-02 LAB — SARS-COV-2, NAA 2 DAY TAT

## 2020-04-02 LAB — NOVEL CORONAVIRUS, NAA: SARS-CoV-2, NAA: NOT DETECTED

## 2020-07-14 ENCOUNTER — Emergency Department (HOSPITAL_COMMUNITY)
Admission: EM | Admit: 2020-07-14 | Discharge: 2020-07-14 | Disposition: A | Discharge status: Home / Self Care | Payer: BC Managed Care – PPO | Attending: Emergency Medicine | Admitting: Emergency Medicine

## 2020-07-14 ENCOUNTER — Encounter (HOSPITAL_COMMUNITY): Payer: Self-pay

## 2020-07-14 ENCOUNTER — Emergency Department (HOSPITAL_COMMUNITY): Payer: BC Managed Care – PPO

## 2020-07-14 DIAGNOSIS — F17210 Nicotine dependence, cigarettes, uncomplicated: Secondary | ICD-10-CM | POA: Diagnosis not present

## 2020-07-14 DIAGNOSIS — R053 Chronic cough: Secondary | ICD-10-CM | POA: Insufficient documentation

## 2020-07-14 DIAGNOSIS — Z21 Asymptomatic human immunodeficiency virus [HIV] infection status: Secondary | ICD-10-CM | POA: Diagnosis not present

## 2020-07-14 DIAGNOSIS — Z85828 Personal history of other malignant neoplasm of skin: Secondary | ICD-10-CM | POA: Diagnosis not present

## 2020-07-14 DIAGNOSIS — R072 Precordial pain: Secondary | ICD-10-CM | POA: Diagnosis not present

## 2020-07-14 DIAGNOSIS — R079 Chest pain, unspecified: Secondary | ICD-10-CM

## 2020-07-14 LAB — CBC
HCT: 49.3 % (ref 39.0–52.0)
Hemoglobin: 16.7 g/dL (ref 13.0–17.0)
MCH: 34.2 pg — ABNORMAL HIGH (ref 26.0–34.0)
MCHC: 33.9 g/dL (ref 30.0–36.0)
MCV: 100.8 fL — ABNORMAL HIGH (ref 80.0–100.0)
Platelets: 158 10*3/uL (ref 150–400)
RBC: 4.89 MIL/uL (ref 4.22–5.81)
RDW: 13.3 % (ref 11.5–15.5)
WBC: 6.7 10*3/uL (ref 4.0–10.5)
nRBC: 0 % (ref 0.0–0.2)

## 2020-07-14 LAB — BASIC METABOLIC PANEL
Anion gap: 11 (ref 5–15)
BUN: 9 mg/dL (ref 8–23)
CO2: 26 mmol/L (ref 22–32)
Calcium: 9 mg/dL (ref 8.9–10.3)
Chloride: 103 mmol/L (ref 98–111)
Creatinine, Ser: 1.09 mg/dL (ref 0.61–1.24)
GFR, Estimated: >60 mL/min (ref >60)
Glucose, Bld: 158 mg/dL — ABNORMAL HIGH (ref 70–99)
Potassium: 4.2 mmol/L (ref 3.5–5.1)
Sodium: 140 mmol/L (ref 135–145)

## 2020-07-14 LAB — TROPONIN I (HIGH SENSITIVITY)
Troponin I (High Sensitivity): 2 ng/L (ref <18)
Troponin I (High Sensitivity): 3 ng/L (ref <18)

## 2020-07-14 NOTE — ED Provider Notes (Signed)
[facility] DEPT Provider Note   CSN: [id number] Arrival date & time: 07/14/20  1328     History Chief Complaint  Patient presents with  . Chest Pain    James Terrell is a 65 y.o. male.  65 yo M with a chief complaint of chest pain.  Is been off and on for the past couple months.  He feels like it is getting a little bit more intense.  Never happens on exertion always happens at rest.  Has improved with sitting up.  Has had a chronic cough for at least 6 months.  Has a 50+ year smoking history.  Has had increased sputum but not really changed over the past 6 months.  No fevers or chills.   The history is provided by the patient.  Chest Pain Pain location:  Substernal area Pain quality: pressure   Pain radiates to:  Does not radiate Pain severity:  Moderate Onset quality:  Gradual Duration:  2 days Timing:  Constant Progression:  Worsening Chronicity:  New Relieved by:  Certain positions Worsened by:  Nothing Ineffective treatments:  None tried Associated symptoms: no abdominal pain, no fever, no headache, no palpitations, no shortness of breath and no vomiting        Past Medical History:  Diagnosis Date  . Anxiety   . Bronchitis, chronic ([place])   . HIV (human immunodeficiency virus infection) ([place]) in 1997   Primary did diagnosis, 1st care in [place] with AZT then dual therapy with retrovir and another stopped in 2001 and has had stable levels  . Onychomycosis    tonnails  . Pneumonia   . Smoker   . Spontaneous pneumothorax     Patient Active Problem List   Diagnosis Date Noted  . Macular degeneration 11/01/2015  . Inguinal hernia 10/15/2013  . Hyperlipidemia 04/21/2013  . Rosacea 04/21/2013  . CANDIDIASIS, ORAL 02/11/2010  . ACUTE BRONCHITIS 02/11/2010  . NIGHT SWEATS 02/11/2010  . ALLERGIC URTICARIA 07/06/2009  . PANIC DISORDER 03/22/2009  . Malignant neoplasm of skin of face 01/11/2009  . Disorder of skin or  subcutaneous tissue 01/05/2009  . TESTOSTERONE DEFICIENCY 10/19/2008  . BRONCHITIS, CHRONIC 07/17/2008  . 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 EXCESSIVE BELCHING 07/17/2008  . BRONCHIAL PNEUMONIA 02/14/2008  . DYSPHAGIA, UNSPECIFIED 02/11/2007  . Human immunodeficiency virus (HIV) disease ([place]) 07/19/2006  . Dermatophytosis of nail 07/19/2006  . ANXIETY 07/19/2006  . SMOKER 07/19/2006  . Pneumothorax 07/19/2006  . SPONTANEOUS PNEUMOTHORAX 07/19/2006    Past Surgical History:  Procedure Laterality Date  . CHEST TUBE INSERTION         Family History  Problem Relation Age of Onset  . Emphysema Father   . COPD Father   . Skin cancer Father   . Heart disease Paternal Aunt   . Heart disease Paternal Uncle   . Heart disease Paternal Grandfather   . Pancreatitis Sister   . Hyperlipidemia Sister   . Macular degeneration Sister   . Blindness Maternal Grandmother     Social History   Tobacco Use  . Smoking status: Current Every Day Smoker    Packs/day: 1.50    Years: 39.00    Pack years: 58.50    Types: Cigarettes  . Smokeless tobacco: Never Used  Substance Use Topics  . Alcohol use: No  . Drug use: No    Home Medications Prior to Admission medications   Medication Sig Start Date End Date Taking? Authorizing Provider  ALPRAZolam Duanne Moron)  0.5 MG tablet Take 0.5 tablets (0.25 mg total) by mouth 2 (two) times daily as needed. 03/15/16   Campbell Riches, MD  emtricitabine-rilpivir-tenofovir AF (ODEFSEY) 200-25-25 MG TABS tablet Take 1 tablet by mouth daily. With food 02/29/16   Campbell Riches, MD  escitalopram (LEXAPRO) 10 MG tablet Take 1 tablet (10 mg total) by mouth daily. 02/29/16   Campbell Riches, MD  ODEFSEY 200-25-25 MG TABS tablet TAKE 1 TABLET BY MOUTH DAILY WITH FOOD 09/29/15   Campbell Riches, MD    Allergies    Patient has no known allergies.  Review of Systems   Review of Systems  Constitutional: Negative for chills and fever.  HENT:  Negative for congestion and facial swelling.   Eyes: Negative for discharge and visual disturbance.  Respiratory: Negative for shortness of breath.   Cardiovascular: Positive for chest pain. Negative for palpitations.  Gastrointestinal: Negative for abdominal pain, diarrhea and vomiting.  Musculoskeletal: Negative for arthralgias and myalgias.  Skin: Negative for color change and rash.  Neurological: Negative for tremors, syncope and headaches.  Psychiatric/Behavioral: Negative for confusion and dysphoric mood.    Physical Exam Updated Vital Signs BP 135/86 (BP Location: Right Arm)   Pulse 80   Temp 98.1 F (36.7 C) (Oral)   Resp 16   Ht 6' 2.5" (1.892 m)   Wt 108.9 kg   SpO2 93%   BMI 30.40 kg/m   Physical Exam Vitals and nursing note reviewed.  Constitutional:      Appearance: He is well-developed and well-nourished.  HENT:     Head: Normocephalic and atraumatic.  Eyes:     Extraocular Movements: EOM normal.     Pupils: Pupils are equal, round, and reactive to light.  Neck:     Vascular: No JVD.  Cardiovascular:     Rate and Rhythm: Normal rate and regular rhythm.     Heart sounds: No murmur heard. No friction rub. No gallop.   Pulmonary:     Effort: No respiratory distress.     Breath sounds: No wheezing.  Abdominal:     General: There is no distension.     Tenderness: There is no guarding or rebound.  Musculoskeletal:        General: Normal range of motion.     Cervical back: Normal range of motion and neck supple.  Skin:    Coloration: Skin is not pale.     Findings: No rash.  Neurological:     Mental Status: He is alert and oriented to person, place, and time.  Psychiatric:        Mood and Affect: Mood and affect normal.        Behavior: Behavior normal.     ED Results / Procedures / Treatments   Labs (all labs ordered are listed, but only abnormal results are displayed) Labs Reviewed  BASIC METABOLIC PANEL - Abnormal; Notable for the following  components:      Result Value   Glucose, Bld 158 (*)    All other components within normal limits  CBC - Abnormal; Notable for the following components:   MCV 100.8 (*)    MCH 34.2 (*)    All other components within normal limits  TROPONIN I (HIGH SENSITIVITY)  TROPONIN I (HIGH SENSITIVITY)    EKG EKG Interpretation  Date/Time:  Wednesday July 14 2020 13:33:52 EST Ventricular Rate:  74 PR Interval:    QRS Duration: 97 QT Interval:  361 QTC Calculation: 401 R Axis:  40 Text Interpretation: Sinus rhythm Probable anteroseptal infarct, old 97 Lead; Mason-Likar No old tracing to compare Confirmed by Deno Etienne [id number]) on 07/14/2020 2:36:03 PM   Radiology DG Chest 2 View  Result Date: 07/14/2020 CLINICAL DATA:  Chest pain EXAM: CHEST - 2 VIEW COMPARISON:  10/31/2017 01/16/2020, FINDINGS: Probable emphysematous disease with suspected bola in the right upper lobe. Chronic bronchitic changes with probable scarring at the lingula. No consolidation or effusion. Normal heart size. No pneumothorax. IMPRESSION: No active cardiopulmonary disease. Probable emphysematous disease with chronic bronchitic changes and scarring at the lingula Electronically Signed   By: Donavan Foil M.D.   On: 07/14/2020 15:36    Procedures Procedures  Discussed smoking cessation with patient and was they were offerred resources to help stop.  Total time was 5 min CPT code 99406.   Medications Ordered in ED Medications - No data to display  ED Course  I have reviewed the triage vital signs and the nursing notes.  Pertinent labs & imaging results that were available during my care of the patient were reviewed by me and considered in my medical decision making (see chart for details).    MDM Rules/Calculators/A&P                          65 yo M with a chief complaint of chest pain.  Atypical in nature.  Going on for couple months.  Initial troponin is negative.  EKG without ischemic findings.  Chest  x-ray viewed by me without focal infiltrate.  He likely has some component of COPD though not actively having an exacerbation based on history and physical.  Delta negative.    7:59 PM:  I have discussed the diagnosis/risks/treatment options with the patient and believe the pt to be eligible for discharge home to follow-up with PCP. We also discussed returning to the ED immediately if new or worsening sx occur. We discussed the sx which are most concerning (e.g., sudden worsening pain, fever, inability to tolerate by mouth) that necessitate immediate return. Medications administered to the patient during their visit and any new prescriptions provided to the patient are listed below.  Medications given during this visit Medications - No data to display   The patient appears reasonably screen and/or stabilized for discharge and I doubt any other medical condition or other [facility] requiring further screening, evaluation, or treatment in the ED at this time prior to discharge.    Final Clinical Impression(s) / ED Diagnoses Final diagnoses:  Nonspecific chest pain    Rx / DC Orders ED Discharge Orders    None       Deno Etienne, DO 07/14/20 1959

## 2020-07-14 NOTE — Discharge Instructions (Signed)
Try pepcid or tagamet up to twice a day.  Try to avoid things that may make this worse, most commonly these are spicy foods tomato based products fatty foods chocolate and peppermint.  Alcohol and tobacco can also make this worse.  Return to the emergency department for sudden worsening pain fever or inability to eat or drink.  

## 2020-07-14 NOTE — ED Triage Notes (Signed)
Pt presents with what he calls chest tightness/shortness of breath for approx 2 months. Pt reports he is a heavy smoker, did call his PCP for an appointment but she told him to come to the ER because of his symptoms.

## 2021-05-19 IMAGING — CR DG CHEST 2V
2 series · 2 of 2 positions shown · non-contrast
Comparison: 10/31/2017 01/16/2020,

CLINICAL DATA: Chest pain

EXAM:
CHEST - 2 VIEW

[w chest pa]
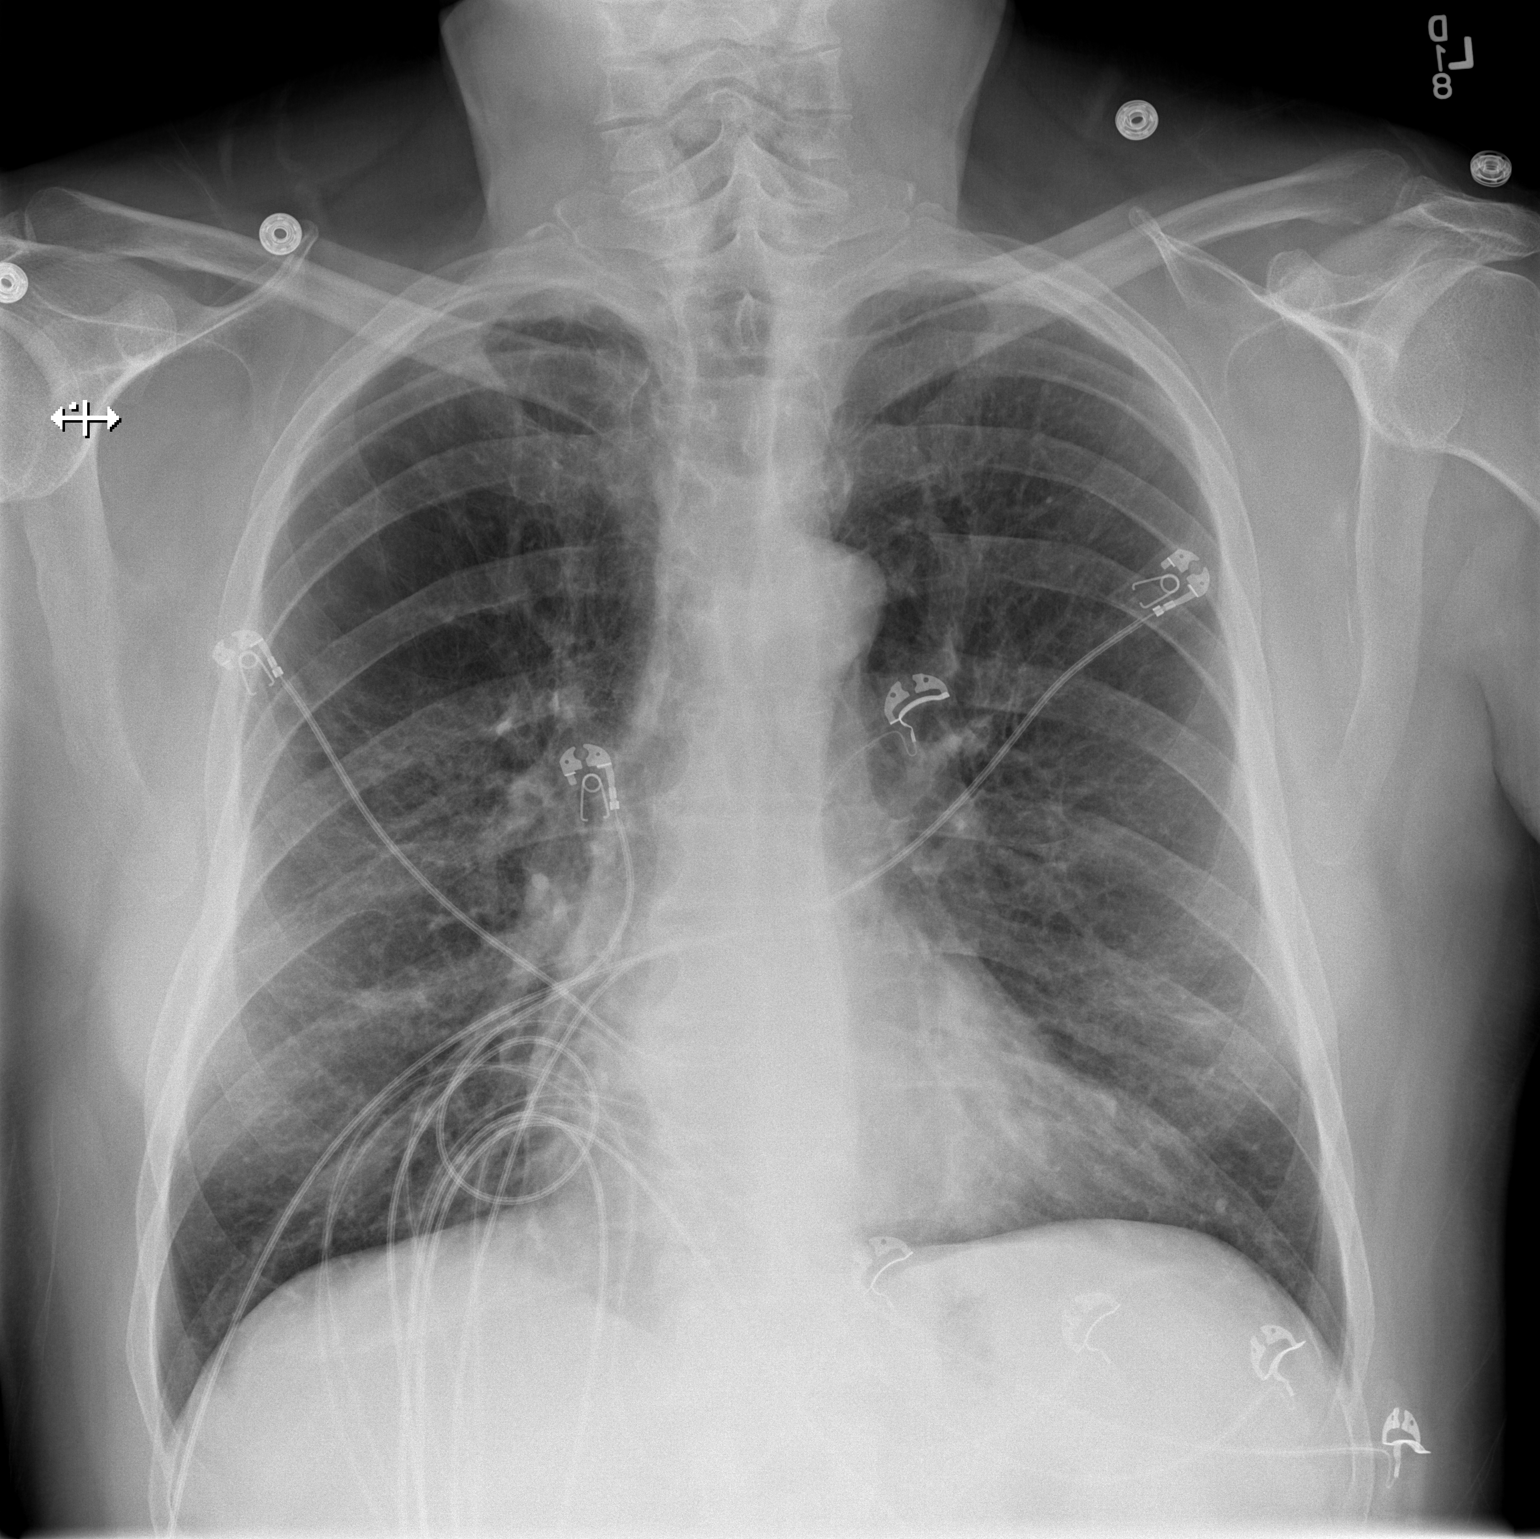

[w chest lat]
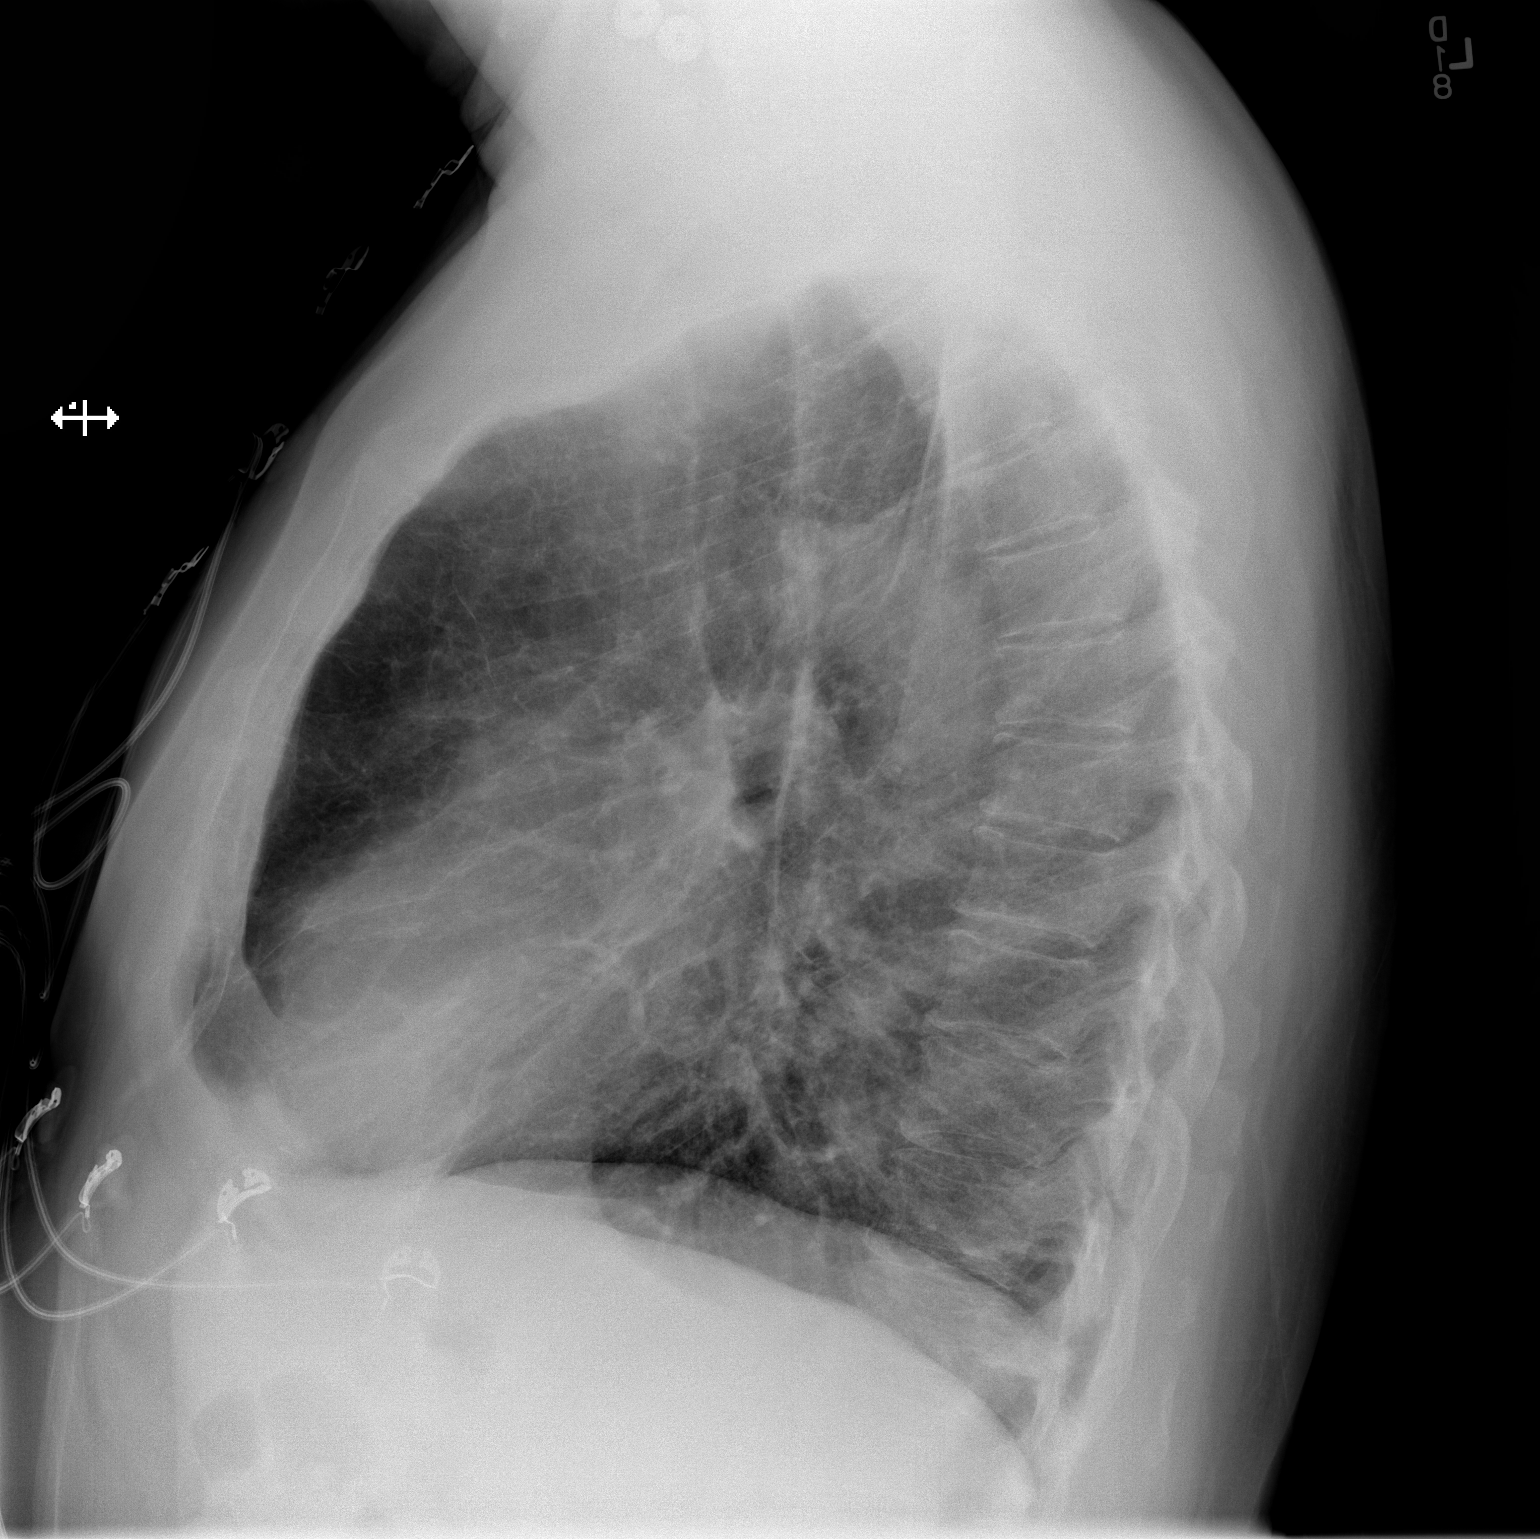

[2 of 2 positions shown; findings below may reference images not displayed]

FINDINGS: Probable emphysematous disease with suspected Zhu in the right
upper lobe. Chronic bronchitic changes with probable scarring at the
lingula. No consolidation or effusion. Normal heart size. No
pneumothorax.
IMPRESSION: No active cardiopulmonary disease. Probable emphysematous disease
with chronic bronchitic changes and scarring at the lingula

## 2022-01-23 HISTORY — PX: HERNIA REPAIR: SHX51

## 2023-06-24 ENCOUNTER — Encounter (HOSPITAL_BASED_OUTPATIENT_CLINIC_OR_DEPARTMENT_OTHER): Payer: Self-pay | Admitting: Emergency Medicine

## 2023-06-24 ENCOUNTER — Other Ambulatory Visit: Payer: Self-pay

## 2023-06-24 DIAGNOSIS — M5442 Lumbago with sciatica, left side: Secondary | ICD-10-CM | POA: Diagnosis not present

## 2023-06-24 DIAGNOSIS — M545 Low back pain, unspecified: Secondary | ICD-10-CM | POA: Diagnosis present

## 2023-06-24 NOTE — ED Triage Notes (Addendum)
Pt to ED from home c/o left leg pain.  States pain started in left hip 2 weeks ago and now radiating down left leg to calf.  Denies known injury.  Tylenol has been helping but no relief today from tylenol, found old prescription of meloxicam 7.5mg  and had some relief after taking.  Denies swelling to leg or SOB.

## 2023-06-25 ENCOUNTER — Emergency Department (HOSPITAL_BASED_OUTPATIENT_CLINIC_OR_DEPARTMENT_OTHER)
Admission: EM | Admit: 2023-06-25 | Discharge: 2023-06-25 | Disposition: A | Discharge status: Home / Self Care | Payer: Medicare Other | Attending: Emergency Medicine | Admitting: Emergency Medicine

## 2023-06-25 DIAGNOSIS — M5432 Sciatica, left side: Secondary | ICD-10-CM

## 2023-06-25 MED ORDER — KETOROLAC TROMETHAMINE 30 MG/ML IJ SOLN
30.0000 mg | Freq: Once | INTRAMUSCULAR | Status: AC
  Administered 2023-06-25: 30 mg via INTRAMUSCULAR
  Filled 2023-06-25: qty 1

## 2023-06-25 MED ORDER — PREDNISONE 50 MG PO TABS
60.0000 mg | ORAL_TABLET | Freq: Once | ORAL | Status: AC
  Administered 2023-06-25: 60 mg via ORAL
  Filled 2023-06-25: qty 1

## 2023-06-25 MED ORDER — PREDNISONE 10 MG (21) PO TBPK
ORAL_TABLET | ORAL | 0 refills | Status: AC
Start: 2023-06-25 — End: ?

## 2023-06-25 MED ORDER — METHOCARBAMOL 500 MG PO TABS: 500.0000 mg | ORAL_TABLET | Freq: Two times a day (BID) | ORAL | 0 refills | Status: AC

## 2023-06-25 MED ORDER — NAPROXEN 500 MG PO TABS
500.0000 mg | ORAL_TABLET | Freq: Two times a day (BID) | ORAL | 0 refills | Status: AC
Start: 2023-06-25 — End: ?

## 2023-06-25 NOTE — ED Provider Notes (Signed)
[place] EMERGENCY DEPARTMENT AT [facility]  Provider Note  CSN: [id number] Arrival date & time: 06/24/23 2336  History Chief Complaint  Patient presents with   Leg Pain    James Terrell is a 68 y.o. male reports 2 weeks of progressive pain, starting in L lower back, and progressing to L hip and now radiating down his L leg. No numbness or weakness. No falls or injuries. No fever or difficulty with bowels or bladder.    Home Medications Prior to Admission medications   Medication Sig Start Date End Date Taking? Authorizing Provider  methocarbamol (ROBAXIN) 500 MG tablet Take 1 tablet (500 mg total) by mouth 2 (two) times daily. 06/25/23  Yes Pollyann Savoy, MD  naproxen (NAPROSYN) 500 MG tablet Take 1 tablet (500 mg total) by mouth 2 (two) times daily. 06/25/23  Yes Pollyann Savoy, MD  predniSONE (STERAPRED UNI-PAK 21 TAB) 10 MG (21) TBPK tablet 10mg  Tabs, 6 day taper. Use as directed 06/25/23  Yes Pollyann Savoy, MD  ALPRAZolam Prudy Feeler) 0.5 MG tablet Take 0.5 tablets (0.25 mg total) by mouth 2 (two) times daily as needed. 03/15/16   Ginnie Smart, MD  emtricitabine-rilpivir-tenofovir AF (ODEFSEY) 200-25-25 MG TABS tablet Take 1 tablet by mouth daily. With food 02/29/16   Ginnie Smart, MD  escitalopram (LEXAPRO) 10 MG tablet Take 1 tablet (10 mg total) by mouth daily. 02/29/16   Ginnie Smart, MD  ODEFSEY 200-25-25 MG TABS tablet TAKE 1 TABLET BY MOUTH DAILY WITH FOOD 09/29/15   Ginnie Smart, MD     Allergies    Patient has no known allergies.   Review of Systems   Review of Systems Please see HPI for pertinent positives and negatives  Physical Exam BP (!) 151/96 (BP Location: Right Arm)   Pulse 69   Temp 97.8 F (36.6 C) (Oral)   Resp 16   Ht 6\' 2"  (1.88 m)   Wt 111.1 kg   SpO2 95%   BMI 31.46 kg/m   Physical Exam Vitals and nursing note reviewed.  Constitutional:      Appearance: Normal appearance.  HENT:     Head:  Normocephalic and atraumatic.     Nose: Nose normal.     Mouth/Throat:     Mouth: Mucous membranes are moist.  Eyes:     Extraocular Movements: Extraocular movements intact.     Conjunctiva/sclera: Conjunctivae normal.  Cardiovascular:     Rate and Rhythm: Normal rate.  Pulmonary:     Effort: Pulmonary effort is normal.     Breath sounds: Normal breath sounds.  Abdominal:     General: Abdomen is flat.     Palpations: Abdomen is soft.     Tenderness: There is no abdominal tenderness.  Musculoskeletal:        General: Tenderness (L lumbar paraspinal muscles and L sciatic notch) present. No swelling. Normal range of motion.     Cervical back: Neck supple.  Skin:    General: Skin is warm and dry.  Neurological:     General: No focal deficit present.     Mental Status: He is alert and oriented to person, place, and time.     Cranial Nerves: No cranial nerve deficit.     Sensory: No sensory deficit.     Motor: No weakness.     Gait: Gait normal.  Psychiatric:        Mood and Affect: Mood normal.     ED  Results / Procedures / Treatments   EKG None  Procedures Procedures  Medications Ordered in the ED Medications  ketorolac (TORADOL) 30 MG/ML injection 30 mg (has no administration in time range)  predniSONE (DELTASONE) tablet 60 mg (has no administration in time range)    Initial Impression and Plan  Patient here with symptoms consistent with sciatica, no red flags. He tried taking some left over meloxicam from prior Rx but not very effective. Will give a dose of toradol and prednisone here, plan discharge with Rx for naprosyn, robaxin, pred-pak and PCP follow up, RTED for any other concerns.    ED Course       MDM Rules/Calculators/A&P Medical Decision Making Problems Addressed: Sciatica of left side: acute illness or injury  Risk Prescription drug management.     Final Clinical Impression(s) / ED Diagnoses Final diagnoses:  Sciatica of left side    Rx  / DC Orders ED Discharge Orders          Ordered    naproxen (NAPROSYN) 500 MG tablet  2 times daily        06/25/23 0209    methocarbamol (ROBAXIN) 500 MG tablet  2 times daily        06/25/23 0209    predniSONE (STERAPRED UNI-PAK 21 TAB) 10 MG (21) TBPK tablet        06/25/23 0209             Pollyann Savoy, MD 06/25/23 [phone]

## 2023-07-27 ENCOUNTER — Other Ambulatory Visit (HOSPITAL_COMMUNITY): Payer: Self-pay | Admitting: Surgery

## 2023-07-27 DIAGNOSIS — M5416 Radiculopathy, lumbar region: Secondary | ICD-10-CM

## 2023-08-07 ENCOUNTER — Ambulatory Visit: Attending: Surgery | Admitting: Physical Therapy

## 2023-08-07 ENCOUNTER — Other Ambulatory Visit: Payer: Self-pay

## 2023-08-07 DIAGNOSIS — M62830 Muscle spasm of back: Secondary | ICD-10-CM | POA: Insufficient documentation

## 2023-08-07 DIAGNOSIS — M5459 Other low back pain: Secondary | ICD-10-CM | POA: Insufficient documentation

## 2023-08-07 NOTE — Therapy (Signed)
 OUTPATIENT PHYSICAL THERAPY THORACOLUMBAR EVALUATION   Patient Name: James Terrell MRN: [id number] DOB:10/17/1955, 68 y.o., male Today's Date: 08/07/2023  END OF SESSION:  PT End of Session - 08/07/23 1226     Visit Number 1    Number of Visits 12    Date for PT Re-Evaluation 09/18/23    PT Start Time 1101    PT Stop Time 1150    PT Time Calculation (min) 49 min    Activity Tolerance Patient tolerated treatment well    Behavior During Therapy WFL for tasks assessed/performed             Past Medical History:  Diagnosis Date   Anxiety    Bronchitis, chronic (HCC)    HIV (human immunodeficiency virus infection) (HCC) in 1997   Primary did diagnosis, 1st care in [place] [facility] with AZT then dual therapy with retrovir and another stopped in 2001 and has had stable levels   Onychomycosis    tonnails   Pneumonia    Smoker    Spontaneous pneumothorax    Past Surgical History:  Procedure Laterality Date   CHEST TUBE INSERTION     Patient Active Problem List   Diagnosis Date Noted   Macular degeneration 11/01/2015   Inguinal hernia 10/15/2013   Hyperlipidemia 04/21/2013   Rosacea 04/21/2013   CANDIDIASIS, ORAL 02/11/2010   ACUTE BRONCHITIS 02/11/2010   NIGHT SWEATS 02/11/2010   ALLERGIC URTICARIA 07/06/2009   PANIC DISORDER 03/22/2009   Malignant neoplasm of skin of face 01/11/2009   Disorder of skin or subcutaneous tissue 01/05/2009   TESTOSTERONE DEFICIENCY 10/19/2008   BRONCHITIS, CHRONIC 07/17/2008   ERECTILE DYSFUNCTION, 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  SUBJECTIVE:                                                                                                                                                                                           SUBJECTIVE STATEMENT: The patient presents to the clinic with c/o left-sided low back pain and numbness over his lower leg that came on for no apparent reason about three months ago. His pain had got so bad he had  an ED visit on 06/25/23.   His pain is a low 2/10 today.    PERTINENT HISTORY:  Please see above.    PAIN:  Are you having pain? Yes: NPRS scale: 2/10. Pain location: Left low back. Pain description: Numb. Aggravating factors: Certain movements. Relieving factors: Rest.  PRECAUTIONS: None  RED FLAGS: None   WEIGHT BEARING RESTRICTIONS: No  FALLS:  Has patient fallen in last 6 months? No  LIVING ENVIRONMENT: Lives in: House/apartment.  He reports he has slept on a sofa for many years. Has following equipment at home: None  OCCUPATION: Retired.  PLOF: Independent  PATIENT GOALS: Not have pain.    OBJECTIVE:  Note: Objective measures were completed at Evaluation unless otherwise noted.  DIAGNOSTIC FINDINGS:  07/04/2023 3:48 PM EST  1.  No acute fracture or traumatic malalignment. 2.  No spondylolisthesis. 3.  Mild multilevel degenerative disc disease. 4.  Moderate facet arthropathy at L4-L5 and L5-S1.   PATIENT SURVEYS:  Modified Oswestry 7/50.    POSTURE: No Significant postural limitations  PALPATION: Tender to palpation in left SIJ region and proximal gluteal musculature.  LUMBAR ROM:   Active lumbar flexion limited by 25% and extension to 20 degrees.    LOWER EXTREMITY ROM:     In supine:  left hip flexion to 90 degrees and right 105 degrees.  LOWER EXTREMITY MMT:    Normal LE strength.  LUMBAR SPECIAL TESTS:  Equal leg  lengths. (-) FABER testing.  Pain reproduction with a left SLR.  Absent left Patellar reflex.  Right is normal.    GAIT: WNL.    TREATMENT DATE: HMP and IFC at 80-150 Hx on 40% scan x 20 minutes to patient's left LB/SIJ region.     Normal modality response following removal of modality.                                                                                                                               PATIENT EDUCATION:  Education details:  Person educated:  International aid/development worker:  Education comprehension:   HOME EXERCISE PROGRAM:   ASSESSMENT:  CLINICAL IMPRESSION: The patient presents to [facility] with c/o left-sided low back pain and numbness reported over the area of his left Tibialis anterior.  His pain presented about three months ago and got so bad it resulted in an ED visit.  He is tender to palpation in left SIJ region and proximal gluteal musculature.  His left Patellar reflex is absent.  He has pain reproduction with a left SLR test.  His Modified Owestry score is 7/50.   Patient will benefit from skilled physical therapy intervention to address pain and deficits.  OBJECTIVE IMPAIRMENTS: decreased activity tolerance, decreased ROM, increased muscle spasms, and pain.   ACTIVITY LIMITATIONS: carrying, lifting, and bending  PARTICIPATION LIMITATIONS: meal prep, cleaning, laundry, and community activity  REHAB POTENTIAL: Good  CLINICAL DECISION MAKING: Evolving/moderate complexity  EVALUATION COMPLEXITY: Low   GOALS:  LONG TERM GOALS: Target date: 09/18/23.  Ind with an HEP. Goal status: INITIAL  2.  Eliminate left lower leg numbness.  Goal status: INITIAL  3.  Perform ADL's with pain not > 2-3/10.  Goal status: INITIAL   PLAN:  PT FREQUENCY: 2x/week  PT DURATION: 6 weeks  PLANNED INTERVENTIONS: 97110-Therapeutic exercises, 97530- Therapeutic activity, [id number]- Neuromuscular re-education, 97535- Self Care, 16109- Manual therapy, G0283- Electrical  stimulation (unattended), 97035- Ultrasound, 60454- Traction (mechanical), Patient/Family education, Dry Needling, Cryotherapy, and Moist heat.  PLAN FOR NEXT SESSION: Combo e'stim/US at 1.50 W/CM2, STW/M, core exercise progression, spinal protection techniques and body mechanics training.  Intermittent lumbar traction beginning at 35% body weight.      Theda Payer, [place], PT 08/07/2023, 12:53 PM

## 2023-08-09 ENCOUNTER — Ambulatory Visit: Admitting: Physical Therapy

## 2023-08-09 DIAGNOSIS — M62830 Muscle spasm of back: Secondary | ICD-10-CM

## 2023-08-09 DIAGNOSIS — M5459 Other low back pain: Secondary | ICD-10-CM | POA: Diagnosis not present

## 2023-08-09 NOTE — Therapy (Signed)
 OUTPATIENT PHYSICAL THERAPY THORACOLUMBAR TREATMENT   Patient Name: James Terrell MRN: [id number] DOB:Oct 27, 1955, 68 y.o., male Today's Date: 08/09/2023  END OF SESSION:  PT End of Session - 08/09/23 1633     Visit Number 2    Number of Visits 12    Date for PT Re-Evaluation 09/18/23    PT Start Time 0400    PT Stop Time 0504    PT Time Calculation (min) 64 min    Activity Tolerance Patient tolerated treatment well    Behavior During Therapy WFL for tasks assessed/performed             Past Medical History:  Diagnosis Date   Anxiety    Bronchitis, chronic (HCC)    HIV (human immunodeficiency virus infection) (HCC) in 1997   Primary did diagnosis, 1st care in [place] [facility] with AZT then dual therapy with retrovir and another stopped in 2001 and has had stable levels   Onychomycosis    tonnails   Pneumonia    Smoker    Spontaneous pneumothorax    Past Surgical History:  Procedure Laterality Date   CHEST TUBE INSERTION     Patient Active Problem List   Diagnosis Date Noted   Macular degeneration 11/01/2015   Inguinal hernia 10/15/2013   Hyperlipidemia 04/21/2013   Rosacea 04/21/2013   CANDIDIASIS, ORAL 02/11/2010   ACUTE BRONCHITIS 02/11/2010   NIGHT SWEATS 02/11/2010   ALLERGIC URTICARIA 07/06/2009   PANIC DISORDER 03/22/2009   Malignant neoplasm of skin of face 01/11/2009   Disorder of skin or subcutaneous tissue 01/05/2009   TESTOSTERONE DEFICIENCY 10/19/2008   BRONCHITIS, CHRONIC 07/17/2008   ERECTILE DYSFUNCTION, 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  SUBJECTIVE:                                                                                                                                                                                           SUBJECTIVE STATEMENT: Tolerable today.   PERTINENT HISTORY:  Please see above.    PAIN:  Are you having pain? Yes: NPRS scale: 2/10. Pain location: Left low back. Pain description: Numb. Aggravating factors: Certain movements. Relieving factors:  Rest.  PRECAUTIONS: None  RED FLAGS: None   WEIGHT BEARING RESTRICTIONS: No  FALLS:  Has patient fallen in last 6 months? No  LIVING ENVIRONMENT: Lives in: House/apartment.  He reports he has slept on a sofa for many years. Has following equipment at home: None  OCCUPATION: Retired.  PLOF: Independent  PATIENT GOALS: Not have pain.    OBJECTIVE:  Note: Objective measures were completed at Evaluation unless otherwise noted.  DIAGNOSTIC FINDINGS:  07/04/2023 3:48 PM EST  1.  No acute fracture or traumatic malalignment. 2.  No spondylolisthesis. 3.  Mild multilevel degenerative disc disease. 4.  Moderate facet arthropathy at L4-L5 and L5-S1.   PATIENT SURVEYS:  Modified Oswestry 7/50.    POSTURE: No Significant postural limitations  PALPATION: Tender to palpation in left SIJ region and proximal gluteal musculature.  LUMBAR ROM:   Active lumbar flexion limited by 25% and extension to 20 degrees.    LOWER EXTREMITY ROM:     In supine:  left hip flexion to 90 degrees and right 105 degrees.  LOWER EXTREMITY MMT:    Normal LE strength.  LUMBAR SPECIAL TESTS:  Equal leg lengths. (-) FABER testing.  Pain reproduction with a left SLR.  Absent left Patellar reflex.  Right is normal.    GAIT: WNL.    TREATMENT DATE: 08/09/23:  Right sdly position with folded pillow between knees:  STW/M x 9 minutes to  patient's left SIJ and proximal gluteal musculature f/b Int lumbar traction at 80# with a 99 sec hold and 5 sec rest f/b HMP and IFC at 80-150 Hx on 40% scan x 20 minutes to patient's left LB/SIJ region.     Normal modality response following removal of modality.                                                                                                                               PATIENT EDUCATION:  Education details:  Person educated:  International aid/development worker:  Education comprehension:   HOME EXERCISE PROGRAM:   ASSESSMENT:  CLINICAL IMPRESSION: Patient tolerated treatment well including intermittent traction at 80#.  He felt very good after today's treatment.  OBJECTIVE IMPAIRMENTS: decreased activity tolerance, decreased ROM, increased muscle spasms, and pain.   ACTIVITY LIMITATIONS: carrying, lifting, and bending  PARTICIPATION LIMITATIONS: meal prep, cleaning, laundry, and community activity  REHAB POTENTIAL: Good  CLINICAL DECISION MAKING: Evolving/moderate complexity  EVALUATION COMPLEXITY: Low   GOALS:  LONG TERM GOALS: Target date: 09/18/23.  Ind with an HEP. Goal status: INITIAL  2.  Eliminate left lower leg numbness.  Goal status: INITIAL  3.  Perform ADL's with pain not > 2-3/10.  Goal status: INITIAL   PLAN:  PT FREQUENCY: 2x/week  PT DURATION: 6 weeks  PLANNED INTERVENTIONS: 97110-Therapeutic exercises, 97530- Therapeutic activity, [id number]- Neuromuscular re-education, 97535- Self Care, 82956- Manual therapy, G0283- Electrical stimulation (unattended), 97035- Ultrasound, 21308- Traction (mechanical), Patient/Family education, Dry Needling, Cryotherapy, and Moist heat.  PLAN FOR NEXT SESSION: Combo e'stim/US at 1.50 W/CM2, STW/M, core exercise progression, spinal protection techniques and body mechanics training.  Intermittent lumbar traction beginning at 35% body weight.      Bentli Llorente, [place], PT 08/09/2023, 6:01 PM

## 2023-08-13 ENCOUNTER — Ambulatory Visit

## 2023-08-13 DIAGNOSIS — M5459 Other low back pain: Secondary | ICD-10-CM | POA: Diagnosis not present

## 2023-08-13 DIAGNOSIS — M62830 Muscle spasm of back: Secondary | ICD-10-CM

## 2023-08-13 NOTE — Therapy (Signed)
 OUTPATIENT PHYSICAL THERAPY THORACOLUMBAR TREATMENT   Patient Name: James Terrell MRN: [id number] DOB:09-21-1955, 68 y.o., male Today's Date: 08/13/2023  END OF SESSION:  PT End of Session - 08/13/23 1608     Visit Number 3    Number of Visits 12    Date for PT Re-Evaluation 09/18/23    PT Start Time 1600    PT Stop Time 1648    PT Time Calculation (min) 48 min    Activity Tolerance Patient tolerated treatment well    Behavior During Therapy WFL for tasks assessed/performed              Past Medical History:  Diagnosis Date   Anxiety    Bronchitis, chronic (HCC)    HIV (human immunodeficiency virus infection) (HCC) in 1997   Primary did diagnosis, 1st care in [place] [facility] with AZT then dual therapy with retrovir and another stopped in 2001 and has had stable levels   Onychomycosis    tonnails   Pneumonia    Smoker    Spontaneous pneumothorax    Past Surgical History:  Procedure Laterality Date   CHEST TUBE INSERTION     Patient Active Problem List   Diagnosis Date Noted   Macular degeneration 11/01/2015   Inguinal hernia 10/15/2013   Hyperlipidemia 04/21/2013   Rosacea 04/21/2013   CANDIDIASIS, ORAL 02/11/2010   ACUTE BRONCHITIS 02/11/2010   NIGHT SWEATS 02/11/2010   ALLERGIC URTICARIA 07/06/2009   PANIC DISORDER 03/22/2009   Malignant neoplasm of skin of face 01/11/2009   Disorder of skin or subcutaneous tissue 01/05/2009   TESTOSTERONE DEFICIENCY 10/19/2008   BRONCHITIS, CHRONIC 07/17/2008   ERECTILE DYSFUNCTION, 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  SUBJECTIVE:                                                                                                                                                                                           SUBJECTIVE STATEMENT: Patient reports that he feels alright today. He felt alright after his last appointment.   PERTINENT HISTORY:  Please see above.    PAIN:  Are you having pain? Yes: NPRS scale: 2/10. Pain  location: Left low back. Pain description: Numb. Aggravating factors: Certain movements. Relieving factors: Rest.  PRECAUTIONS: None  RED FLAGS: None   WEIGHT BEARING RESTRICTIONS: No  FALLS:  Has patient fallen in last 6 months? No  LIVING ENVIRONMENT: Lives in: House/apartment.  He reports he has slept on a sofa for many years. Has following equipment at home: None  OCCUPATION: Retired.  PLOF: Independent  PATIENT GOALS: Not have pain.    OBJECTIVE:  Note: Objective measures were completed at Evaluation unless otherwise noted.  DIAGNOSTIC FINDINGS:  07/04/2023 3:48 PM EST  1.  No acute fracture or traumatic malalignment. 2.  No spondylolisthesis. 3.  Mild multilevel degenerative disc disease. 4.  Moderate facet arthropathy at L4-L5 and L5-S1.   PATIENT SURVEYS:  Modified Oswestry 7/50.    POSTURE: No Significant postural limitations  PALPATION: Tender to palpation in left SIJ region and proximal gluteal musculature.  LUMBAR ROM:   Active lumbar flexion limited by 25% and extension to 20 degrees.    LOWER EXTREMITY ROM:     In supine:  left hip flexion to 90 degrees and right 105 degrees.  LOWER EXTREMITY MMT:    Normal LE strength.  LUMBAR SPECIAL TESTS:  Equal leg lengths. (-) FABER testing.  Pain reproduction with a left SLR.  Absent left Patellar reflex.  Right is normal.    GAIT: WNL.    TREATMENT DATE:                                     08/13/23 EXERCISE LOG  Exercise Repetitions and Resistance Comments  Lunges onto step  6" step x 15 reps  LLE on step   Seated HS stretch  4 x 30 seconds  LLE only   Seated HS isometric  3 minutes w/ 5 second hold   LAQ 3# x 20 reps each    Slouch overcorrect  2 minutes    Rocker board  4 minutes    Blank cell = exercise not performed today  Modalities: no redness or adverse reaction to today's modalities  Date:  Unattended Estim: left QL, pre mod @ 80-150 Hz, 15 mins, Pain Hot Pack: Lumbar, 15 mins, Pain  PATIENT EDUCATION:  Education details: plan of care, and healing Person educated: patient  Education method: verbal Education comprehension: patient reported understanding   HOME EXERCISE PROGRAM:   ASSESSMENT:  CLINICAL IMPRESSION: Patient was introduced to multiple new interventions for improved lumbar and lower extremity mobility. He required minimal cueing with today's new interventions for proper exercise performance. He experienced no increase in pain or discomfort with any of today's interventions. He reported that his back felt better upon the conclusion of treatment. He continues to require skilled physical therapy to address his remaining impairments to maximize his functional mobility.   OBJECTIVE IMPAIRMENTS: decreased activity tolerance, decreased ROM, increased muscle spasms, and pain.   ACTIVITY LIMITATIONS: carrying, lifting, and bending  PARTICIPATION LIMITATIONS: meal prep, cleaning, laundry, and community activity  REHAB POTENTIAL: Good  CLINICAL DECISION MAKING: Evolving/moderate complexity  EVALUATION COMPLEXITY: Low   GOALS:  LONG TERM GOALS: Target date: 09/18/23.  Ind with an HEP. Goal status: INITIAL  2.  Eliminate left lower leg numbness.  Goal status: INITIAL  3.  Perform ADL's with pain not > 2-3/10.  Goal status: INITIAL   PLAN:  PT FREQUENCY: 2x/week  PT DURATION: 6 weeks  PLANNED INTERVENTIONS:  97110-Therapeutic exercises,  91478- Therapeutic activity, [id number]- Neuromuscular re-education, [id number]- Self Care, 29562- Manual therapy, G0283- Electrical stimulation (unattended), [id number]- Ultrasound, [id number]- Traction (mechanical), Patient/Family education, Dry Needling, Cryotherapy, and Moist heat.  PLAN FOR NEXT SESSION: Combo e'stim/US at 1.50 W/CM2, STW/M, core exercise progression, spinal protection techniques and body mechanics training.  Intermittent lumbar traction beginning at 35% body weight.      Granville Lewis, PT 08/13/2023, 5:27 PM

## 2023-08-17 ENCOUNTER — Encounter: Admitting: *Deleted

## 2023-09-27 DIAGNOSIS — M545 Low back pain, unspecified: Secondary | ICD-10-CM

## 2023-09-27 HISTORY — DX: Low back pain, unspecified: M54.50

## 2023-10-23 DIAGNOSIS — N529 Male erectile dysfunction, unspecified: Secondary | ICD-10-CM

## 2023-10-23 HISTORY — DX: Male erectile dysfunction, unspecified: N52.9

## 2023-12-04 DIAGNOSIS — J439 Emphysema, unspecified: Secondary | ICD-10-CM

## 2023-12-04 DIAGNOSIS — I700 Atherosclerosis of aorta: Secondary | ICD-10-CM

## 2023-12-04 HISTORY — DX: Atherosclerosis of aorta: I70.0

## 2023-12-04 HISTORY — DX: Emphysema, unspecified: J43.9

## 2023-12-26 DIAGNOSIS — C801 Malignant (primary) neoplasm, unspecified: Secondary | ICD-10-CM

## 2023-12-26 HISTORY — DX: Malignant (primary) neoplasm, unspecified: C80.1

## 2023-12-26 NOTE — Progress Notes (Addendum)
 Subjective: James Terrell comes in today as a return patient for evaluation of skin lesions.  No new or changing spots of concern  He has history of multiple BCCs. Most recently BCC of L postauricular area, s/p Mohs in 01/2023 by Dr Delaney.  in 2017 he had one on the L medial cheek near the nose that we recommended him to have Mohs for. He did not have the procedure. However, the [facility] did not recur. He had a biopsy of a lesion on the R temple in 05/2021 which was c/w SK.  We have treated seb dermatitis on the face with HC in the past  Review of systems is negative for any other lumps, swelling, or dermatologic conditions.   Objective:  Patient is well nourished, well hydrated 68 y.o. male in no acute distress with appropriate mood and affect. Exam today focused on the skin and included the following areas:  Face (Ears, Nose, Mouth) Scalp Neck Chest Bilateral upper and lower extremities    Pertinent Findings include:              -    No evidence for recurrence seen in scar of shave biopsy of the left side of the nose which was proven to be BCC       -     Verrucous papules c/w SKs on trunk and extremities - lesion of concern on R temple is a pigmented SK       -     Hyperpigmented macules c/w lentigines on sunexposed surfaces      -   pink papule on L chest and hyperkeratotic papule on L forearm -  biopsy x2       -    pink thin papule on L shoulder c/w sBCC 1.2cm - LN2        -     tiny pink papule on the R nasal ala that is somewhat concerning - patient opted to monitor             Assessment:  1. Neoplasm of uncertain behavior of skin  Skin Biopsy   Skin Biopsy   Tissue Exam   Tissue Exam    2. Multiple benign melanocytic nevi      3. Other seborrheic keratosis      4. Solar lentigo      5. Superficial basal cell carcinoma           Plan: The above diagnosis and treatment options were reviewed with the patient. Educated the patient today about the  benign nature of some of the lesions noted on today's exam. Encouraged use of sunscreen with an SPF of at least 30, and other sun-protective measures including hats and long-sleeved clothing. Patient educated to perform regular self-skin exams to look for any changing lesions or new lesions that appear suspicious Consent to do a skin biopsy was obtained verbally. After obtaining consent, local anesthesia was accomplished using intradermal lidocaine with epinephrine. A shave biopsy was performed on the 2 sites. Hemostasis was with Drysol. Wound care was reviewed in detail and the patient was urged to call with questions. After discussion of risks and benefits and obtaining verbal consent, cryotherapy for 5 seconds for 3 rounds was applied to 1 sBCC on L shoulder. Patient tolerated the procedure well and instructions for care were provided.  MONITOR the little papule on the R nasal ala The patient was encouraged to call or send a message through [facility] for any questions or concerns. Followup  every 6 months

## 2024-01-01 ENCOUNTER — Encounter (HOSPITAL_COMMUNITY): Payer: Self-pay | Admitting: *Deleted

## 2024-01-01 NOTE — Progress Notes (Signed)
 Patient scheduled for MRI on 01/03/24 at 10 AM due to needing to be sedated but pain has now resolved per patient on 01/01/24.  Patient cancelled this procedure.  Called MRI and spoke with Brooke on 01/01/24 at 6:40 PM.  Lyle asked me to call scheduling tomorrow morning after 8 am @ [phone] to have MRI taken off the schedule for this patient.  PCP - Shawn Lazoff, DO Cardiologist - none Infectious Diseases - Dr Rocky Das Pulmonology = Prentice Graven, PA-C  CT Chest x-ray - 12/04/23 CE EKG - n/a Stress Test - 09/15/20 w/dobutamine  ECHO - n/a Cardiac Cath - n/a  ICD Pacemaker/Loop - n/a  Sleep Study -  n/a  Diabetes - n/a

## 2024-01-03 ENCOUNTER — Encounter (HOSPITAL_COMMUNITY): Admission: RE | Payer: Self-pay | Source: Home / Self Care

## 2024-01-03 ENCOUNTER — Ambulatory Visit (HOSPITAL_COMMUNITY): Payer: Medicare Other

## 2024-01-03 ENCOUNTER — Encounter (HOSPITAL_COMMUNITY): Payer: Self-pay

## 2024-01-03 ENCOUNTER — Ambulatory Visit (HOSPITAL_COMMUNITY): Admission: RE | Admit: 2024-01-03 | Payer: Medicare Other | Source: Home / Self Care

## 2024-01-03 HISTORY — DX: Hyperlipidemia, unspecified: E78.5

## 2024-01-03 SURGERY — MRI WITH ANESTHESIA
Anesthesia: General
